# Patient Record
Sex: Female | Born: 1980 | Hispanic: Yes | Marital: Single | Smoking: Never smoker
Health system: Southern US, Community
[De-identification: ages and names within clinical notes are randomized; demographics above are authoritative.]

## PROBLEM LIST (undated history)

## (undated) ENCOUNTER — Inpatient Hospital Stay (HOSPITAL_COMMUNITY): Payer: Self-pay

## (undated) DIAGNOSIS — Z789 Other specified health status: Secondary | ICD-10-CM

## (undated) DIAGNOSIS — N6320 Unspecified lump in the left breast, unspecified quadrant: Secondary | ICD-10-CM

---

## 2002-12-20 ENCOUNTER — Emergency Department (HOSPITAL_COMMUNITY): Admission: EM | Admit: 2002-12-20 | Discharge: 2002-12-20 | Payer: Self-pay | Admitting: Emergency Medicine

## 2003-10-08 ENCOUNTER — Other Ambulatory Visit: Admission: RE | Admit: 2003-10-08 | Discharge: 2003-10-08 | Payer: Self-pay | Admitting: Gynecology

## 2009-08-30 ENCOUNTER — Encounter: Payer: Self-pay | Admitting: Family Medicine

## 2009-08-30 ENCOUNTER — Ambulatory Visit: Payer: Self-pay | Admitting: Family Medicine

## 2009-08-30 LAB — CONVERTED CEMR LAB
Eosinophils Absolute: 0.1 10*3/uL (ref 0.0–0.7)
Eosinophils Relative: 1 % (ref 0–5)
Hemoglobin: 11.1 g/dL — ABNORMAL LOW (ref 12.0–15.0)
Hepatitis B Surface Ag: NEGATIVE
Lymphocytes Relative: 20 % (ref 12–46)
Lymphs Abs: 1.7 10*3/uL (ref 0.7–4.0)
Monocytes Absolute: 0.6 10*3/uL (ref 0.1–1.0)
RBC: 4.06 M/uL (ref 3.87–5.11)
RDW: 14.3 % (ref 11.5–15.5)
Rubella: 129.9 intl units/mL — ABNORMAL HIGH
WBC: 8.7 10*3/uL (ref 4.0–10.5)

## 2009-09-06 ENCOUNTER — Encounter: Payer: Self-pay | Admitting: Family Medicine

## 2009-09-06 ENCOUNTER — Ambulatory Visit: Payer: Self-pay | Admitting: Family Medicine

## 2009-09-06 DIAGNOSIS — N898 Other specified noninflammatory disorders of vagina: Secondary | ICD-10-CM | POA: Insufficient documentation

## 2009-09-06 LAB — CONVERTED CEMR LAB
Pap Smear: NEGATIVE
Whiff Test: NEGATIVE

## 2009-09-14 ENCOUNTER — Ambulatory Visit (HOSPITAL_COMMUNITY): Admission: RE | Admit: 2009-09-14 | Discharge: 2009-09-14 | Payer: Self-pay | Admitting: Family Medicine

## 2009-09-14 ENCOUNTER — Encounter: Payer: Self-pay | Admitting: Family Medicine

## 2009-09-17 ENCOUNTER — Encounter: Payer: Self-pay | Admitting: Family Medicine

## 2009-10-04 ENCOUNTER — Ambulatory Visit: Payer: Self-pay | Admitting: Family Medicine

## 2009-10-06 ENCOUNTER — Encounter: Payer: Self-pay | Admitting: Family Medicine

## 2009-10-12 ENCOUNTER — Encounter: Payer: Self-pay | Admitting: *Deleted

## 2009-10-14 ENCOUNTER — Ambulatory Visit: Payer: Self-pay | Admitting: Family Medicine

## 2009-10-18 ENCOUNTER — Encounter: Payer: Self-pay | Admitting: Family Medicine

## 2009-10-20 ENCOUNTER — Ambulatory Visit (HOSPITAL_COMMUNITY): Admission: RE | Admit: 2009-10-20 | Discharge: 2009-10-20 | Payer: Self-pay | Admitting: Family Medicine

## 2009-10-20 ENCOUNTER — Encounter: Payer: Self-pay | Admitting: Family Medicine

## 2009-11-05 ENCOUNTER — Ambulatory Visit: Payer: Self-pay | Admitting: Family Medicine

## 2009-11-05 DIAGNOSIS — M545 Low back pain: Secondary | ICD-10-CM

## 2009-12-14 ENCOUNTER — Ambulatory Visit: Payer: Self-pay | Admitting: Family Medicine

## 2009-12-28 ENCOUNTER — Ambulatory Visit: Payer: Self-pay | Admitting: Family Medicine

## 2009-12-28 ENCOUNTER — Encounter: Payer: Self-pay | Admitting: Family Medicine

## 2009-12-28 LAB — CONVERTED CEMR LAB
HCT: 32.1 % — ABNORMAL LOW (ref 36.0–46.0)
WBC: 10 10*3/uL (ref 4.0–10.5)

## 2009-12-29 ENCOUNTER — Encounter: Payer: Self-pay | Admitting: Family Medicine

## 2009-12-29 DIAGNOSIS — R7309 Other abnormal glucose: Secondary | ICD-10-CM

## 2009-12-31 ENCOUNTER — Ambulatory Visit: Payer: Self-pay | Admitting: Family Medicine

## 2009-12-31 ENCOUNTER — Encounter: Payer: Self-pay | Admitting: Family Medicine

## 2010-01-11 ENCOUNTER — Ambulatory Visit: Payer: Self-pay | Admitting: Family Medicine

## 2010-01-27 ENCOUNTER — Ambulatory Visit: Payer: Self-pay | Admitting: Family Medicine

## 2010-02-10 ENCOUNTER — Ambulatory Visit: Payer: Self-pay | Admitting: Family Medicine

## 2010-02-18 ENCOUNTER — Telehealth: Payer: Self-pay | Admitting: Family Medicine

## 2010-02-18 ENCOUNTER — Ambulatory Visit: Payer: Self-pay | Admitting: Family Medicine

## 2010-02-18 ENCOUNTER — Inpatient Hospital Stay (HOSPITAL_COMMUNITY): Admission: AD | Admit: 2010-02-18 | Discharge: 2010-02-18 | Payer: Self-pay | Admitting: Family Medicine

## 2010-02-21 ENCOUNTER — Telehealth: Payer: Self-pay | Admitting: Family Medicine

## 2010-02-24 ENCOUNTER — Ambulatory Visit: Payer: Self-pay | Admitting: Family Medicine

## 2010-02-25 ENCOUNTER — Encounter: Payer: Self-pay | Admitting: Family Medicine

## 2010-03-03 ENCOUNTER — Ambulatory Visit: Payer: Self-pay | Admitting: Family Medicine

## 2010-03-09 ENCOUNTER — Ambulatory Visit: Payer: Self-pay | Admitting: Family Medicine

## 2010-03-17 ENCOUNTER — Encounter: Payer: Self-pay | Admitting: Family Medicine

## 2010-03-17 ENCOUNTER — Ambulatory Visit: Payer: Self-pay | Admitting: Family Medicine

## 2010-03-22 ENCOUNTER — Ambulatory Visit: Payer: Self-pay | Admitting: Family Medicine

## 2010-03-26 ENCOUNTER — Ambulatory Visit: Payer: Self-pay | Admitting: Obstetrics and Gynecology

## 2010-03-26 ENCOUNTER — Inpatient Hospital Stay (HOSPITAL_COMMUNITY): Admission: AD | Admit: 2010-03-26 | Discharge: 2010-03-29 | Payer: Self-pay | Admitting: Obstetrics & Gynecology

## 2010-03-27 ENCOUNTER — Encounter: Payer: Self-pay | Admitting: Obstetrics & Gynecology

## 2010-03-27 ENCOUNTER — Encounter: Payer: Self-pay | Admitting: Family Medicine

## 2010-03-31 ENCOUNTER — Ambulatory Visit: Payer: Self-pay | Admitting: Family Medicine

## 2010-03-31 DIAGNOSIS — D249 Benign neoplasm of unspecified breast: Secondary | ICD-10-CM

## 2010-04-07 ENCOUNTER — Ambulatory Visit: Payer: Self-pay | Admitting: Family Medicine

## 2010-04-07 DIAGNOSIS — N63 Unspecified lump in unspecified breast: Secondary | ICD-10-CM

## 2010-04-25 ENCOUNTER — Ambulatory Visit: Payer: Self-pay | Admitting: Family Medicine

## 2010-05-12 ENCOUNTER — Ambulatory Visit: Payer: Self-pay | Admitting: Family Medicine

## 2010-06-03 ENCOUNTER — Ambulatory Visit: Payer: Self-pay | Admitting: Family Medicine

## 2010-06-03 ENCOUNTER — Encounter: Payer: Self-pay | Admitting: Family Medicine

## 2010-08-21 ENCOUNTER — Encounter: Payer: Self-pay | Admitting: Family Medicine

## 2010-08-30 NOTE — Progress Notes (Signed)
Summary: f/u previous phone call       Additional Follow-up for Phone Call Additional follow up Details #2::    recieved message to call her. baby is not moving. when I called back, [facility] pt states baby is fine, it was just not moving. Follow-up by: Marines Jean Rosenthal    MAU report reviewed and copied into [employer]

## 2010-08-30 NOTE — Assessment & Plan Note (Signed)
Summary: OB VISIT/EO   Vital Signs:  Patient profile:   30 year old female Height:      60.25 inches Weight:      122.1 pounds BMI:     23.73 Temp:     97.8 degrees F oral Pulse rate:   91 / minute BP sitting:   97 / 60  (left arm) Cuff size:   regular  Vitals Entered By: Gladstone Pih (October 04, 2009 1:29 PM) CC: OB Is Patient Diabetic? No Pain Assessment Patient in pain? no      LMP (date): 06/09/2009 EDC by LMP==> 03/16/2010 [facility] 03/16/2010   Primary Care Provider:  Magnus Ivan MD  CC:  OB.  History of Present Illness: OB f/u: patient is here for second OB follow up.  patient complaining of pain in lower back when she tries to get up or after lying down for a few hours after work.  patient also reports a thin white discharge that does not itch.  otherwise no other complaints.  Habits & Providers  Alcohol-Tobacco-Diet     Tobacco Status: never     Cigarette Packs/Day: n/a  Review of Systems       as per hpi  Physical Exam  General:  NAD well-developed and well-nourished.   Heart:  II/VI systolic murmur heard best at RUSB Extremities:  trace left pedal edema and trace right pedal edema.   Psych:  memory intact for recent and remote, normally interactive, good eye contact, and not anxious appearing.     Impression & Recommendations:  Problem # 1:  SUPERVISION OF OTHER NORMAL PREGNANCY (ICD-V22.1) Assessment Unchanged  Patient currently at 16 weeks and 5 days.  There was not too much to do on this visit besides asking regular flowsheet questions and confirming if I missed any intake questions from our first visit.  Patient is interested in getting the quad screen.  She has the [facility] orange card so I believe this should be free.  Will write or call in a prescription for tylenol for the patient's back pain.  Fetal heart tones reassuring.  Will follow up with patient in 1 month.  Orders: Other OB visit- [facility])  Prenatal Visit [facility]  Confirmation:    New working [facility]: 03/16/2010    EDC by LMP: 03/16/2010 Ultrasound Dating Information:    First U/S on 09/14/2009   Gest age: 12w 6d    EDC: 03/23/2010.   Flowsheet View for Follow-up Visit    Estimated weeks of       gestation:     16 5/7    Weight:     122.1    Blood pressure:   97 / 60    Headache:     No    Nausea/vomiting:   No    Edema:     0    Vaginal bleeding:   no    Vaginal discharge:   d/c, white clear, not like yeast infection    FHR:       150's    Fetal activity:     yes, a little     Labor symptoms:   some pain on abdomen    Taking prenatal vits?   Y    Smoking:     n/a    Preceptor:     dr Leveda Anna    Flowsheet View for Follow-up Visit    Estimated weeks of       gestation:     16 5/7  Weight:     122.1    Blood pressure:   97 / 60    Hx headache?     No    Nausea/vomiting?   No    Edema?     0    Bleeding?     no    Leakage/discharge?   d/c, white clear, not like yeast infection    Fetal activity:       yes, a little     Labor symptoms?   some pain on abdomen    FHR:       150's    Taking Vitamins?   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    Number of children at home: 2    [facility] delivery: [facility]  FOB Information    Husband/Father of baby: Tamala Bari    FOB Comments: not supportive   Menstrual History    LMP (date): 06/09/2009    EDC by LMP: 03/16/2010    Best Working EDC: 03/16/2010    LMP - Character: normal    Menarche: 16 years    Menses interval: very regular days    Menstrual flow 3-4 days    On BCP's at conception: no    Symptoms since LMP: amenorrhea, fatigue, irritability, urinary frequency    Other symptoms: some irritability, drinks a lot of water=urinary  frequency    Past Pregnancy History    Gravida:     1    Term Births:     0    Premature Births:   0    Living Children:   0    Para:       0    Mult. Births:     0    Prev C-Section:   0    Prev. VBAC attempt?   0    Aborta:     0    Elect. Ab:     0    Spont. Ab:     0    Ectopics:     0      Appended Document: OB VISIT/EO Dating sono at 23 and 6/7 gives EDD of 03/23/10.  Pt desires quad screen, should return to our lab for lab visit for that.  Also needs to have anatomy scan scheduled before next visit.

## 2010-08-30 NOTE — Assessment & Plan Note (Signed)
Summary: PP visit/mj   Vital Signs:  Patient profile:   30 year old female Height:      60.25 inches Weight:      123 pounds BMI:     23.91 Temp:     98.3 degrees F oral Pulse rate:   75 / minute BP sitting:   99 / 64  (left arm) Cuff size:   regular  Vitals Entered By: Jimmy Footman, CMA (May 12, 2010 10:26 AM) CC: PP visit Is Patient Diabetic? No Pain Assessment Patient in pain? no      Comments vaginal delivery, denies pain or bleeding   Primary Care Provider:  Alvia Grove DO  CC:  PP visit.  History of Present Illness: 30 yo female here for PP visit. Bonding well with infant.  Breastfeeding without problems. No feelings of sadness.  Adjusting well to new baby.  Continues to be able to participate in activites she enjoys.   Husband is very supportive and helps with the care of the infant. Has not resumed sexual intercourse.   Interested in Implanon for [facility].  No vaginal bleeding, no abdominal pain.    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) 5)  Other Abnormal Glucose  (ICD-790.29) 6)  Back Pain, Lumbar  (ICD-724.2) 7)  Vaginal Discharge  (ICD-623.5) 8)  Screening For Malignant Neoplasm of The Cervix  (ICD-V76.2)  Current Medications (verified): 1)  Pre-Natal Formula  Tabs (Prenatal Multivit-Min-Fe-Fa)  Allergies (verified): No Known Drug Allergies  Past History:  Past Medical History: Last updated: 04/07/2010 NSVD in 08//2011  Family History: Last updated: 04/07/2010 no hx of breast ca  Social History: Last updated: 04/25/2010 moved from [place] about 2  yrs ago One son Married Never Smoked Drug use-no  Risk Factors: Alcohol Use: 0 (04/25/2010) Exercise: yes (04/25/2010)  Risk  Factors: Smoking Status: never (05/12/2010) Packs/Day: n/a (04/07/2010)  Review of Systems  The patient denies anorexia, fever, weight loss, weight gain, vision loss, decreased hearing, hoarseness, chest pain, syncope, dyspnea on exertion, peripheral edema, prolonged cough, headaches, hemoptysis, abdominal pain, melena, hematochezia, severe indigestion/heartburn, hematuria, incontinence, genital sores, muscle weakness, suspicious skin lesions, transient blindness, difficulty walking, depression, unusual weight change, abnormal bleeding, enlarged lymph nodes, angioedema, breast masses, and testicular masses.    Physical Exam  General:  VS reviewed, alert, well-developed, well-nourished, and well-hydrated.   Breasts:  slightly engorged, colostrum discharge, no abonormal skin or nipple changes  6 x 6 cm mass in left axilla without erythema or increased warmth, no pain with palpation, with expressible milky liquid from enlarged pore in center.  Lungs:  Normal respiratory effort, chest expands symmetrically. Lungs are clear to auscultation, no crackles or wheezes. Heart:  Normal rate and regular rhythm. S1 and S2 normal without gallop, murmur, click, rub or other extra sounds. Abdomen:  soft, non-tender, normal bowel sounds, no distention, and no masses.   Extremities:  No clubbing, cyanosis, edema, or deformity noted with normal full range of motion of all joints.   Neurologic:  alert & oriented X3 and DTRs symmetrical and normal.   Skin:  turgor normal and color normal.   Psych:  good eye contact, not anxious appearing, and not depressed appearing.    Pelvic Exam  Vulva:  normal appearance, normal hair distribution, no lesions or masses.   Vagina:      normal, ruggated, physiologic discharge, no lesions, no masses, no cystocele, adequate pelvic support.   Cervix:      normal, midposition, no CMT, no lesions.   Uterus:      smooth, anteverted, anteflexed, mobile, non-tender, firm,  adequate support, no prolapse.   Adnexa:      normal, no masses bilaterally, mobile bilaterally, nontender bilaterally.      Impression & Recommendations:  Problem # 1:  POSTPARTUM CARE&EXAMINATION OF LACTATING MOTHER (ICD-V24.1) Doing well. Will schedule appt in November for Implanon.  Pt will qualify for no charge Implanon. see pt instructions [id number]  Problem # 2:  CONTRACEPTIVE MANAGEMENT (ICD-V25.09) as above Orders: [facility]partum visit- [facility] ([id number])  Complete Medication List: 1)  Pre-natal Formula Tabs (Prenatal multivit-min-fe-fa)  Patient Instructions: 1)  Great to see you! 2)  I will put in your Implanon in at your next visit in November, there will be no charge for this visit or the Implanon. 3)  You can get pregnaut while you are breast feeding, make sure you use condoms if you have sex.

## 2010-08-30 NOTE — Assessment & Plan Note (Signed)
Summary: ob,tcb   Vital Signs:  Patient profile:   30 year old female Weight:      146.3 pounds BP sitting:   99 / 65  Habits & Providers  Alcohol-Tobacco-Diet     Cigarette Packs/Day: n/a  Allergies: No Known Drug Allergies  Physical Exam  General:  VS reviewed, alert, well-developed, and well-hydrated.   Abdomen:  gravid Extremities:  trace edema bilateral LE Neurologic:  DTRs symmetrical and normal.     Impression & Recommendations:  Problem # 1:  SUPERVISION OF OTHER NORMAL PREGNANCY (ICD-V22.1) 29yo G1P0 at 39.[redacted] weeks gestation, dated by first U/S at [redacted]W[redacted]D.  EDD 03-23-10 Initial labs: O+, Ab -, Hgb 11.1, Hct 32.7, Plt 259, HBsAg neg, Rubella immune, RPR NR, HIV NR, Sickle cell neg, GC/Chlamydia neg, GBS neg 1hrGTT: 169 3hr: [phone] Quad screen Normal Female gender, plans to breast feed. [facility] for pediatrician Discussed pain management options for labor.  Pt will think about it and we will discuss further next week.   f/u next week.    discussed back pain she is having - likely related to pregnancy.    Orders: Other OB visit- [facility] ([facility])  Complete Medication List: 1)  Pre-natal Formula Tabs (Prenatal multivit-min-fe-fa)  Patient Instructions: 1)  Nice to see you! 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    Number of children at home: 2    [facility]ospital [facility] delivery: [facility]  FOB Information    Husband/Father of baby: Tamala Bari    FOB Comments: not supportive   Menstrual History    LMP (date): 06/09/2009    LMP  - Character: normal    Menarche: 16 years    Menses interval: very regular days    Menstrual flow 3-4 days    On BCP's at conception: no   Flowsheet View for Follow-up Visit    Estimated weeks of       gestation:     39 1/7    Weight:     146.3    Blood pressure:   99 / 65    Headache:     No    Nausea/vomiting:   No    Edema:     1+LE    Vaginal bleeding:   no    Vaginal discharge:   no    Fundal height:      39    FHR:       140's    Fetal activity:     yes    Labor symptoms:   few ctx    Fetal position:     vertex    Taking prenatal vits?   Y    Smoking:     n/a    Next visit:     1 week    Resident:     Braulio Conte    Preceptor:     Mauricio Po

## 2010-08-30 NOTE — Assessment & Plan Note (Signed)
Summary: ob f/u per Dr. Gareth Morgan   Vital Signs:  Patient profile:   30 year old female Weight:      139 pounds BP sitting:   97 / 60  Habits & Providers  Alcohol-Tobacco-Diet     Cigarette Packs/Day: n/a  Current Medications (verified): 1)  Pre-Natal Formula  Tabs (Prenatal Multivit-Min-Fe-Fa)  Physical Exam  General:  Vs reviewed, alert, well-developed, well-nourished, and well-hydrated.   vs noted  Abdomen:  gravid   Impression & Recommendations:  Problem # 1:  SUPERVISION OF OTHER NORMAL PREGNANCY (ICD-V22.1) Assessment Unchanged  30yo G1P0 at 32.[redacted] weeks gestation, dated by first U/S at 100W6D.  EDD 03-23-10 Initial labs: O+, Ab -, Hgb 11.1, Hct 32.7, Plt 259, HBsAg neg, Rubella immune, RPR NR, HIV NR, Sickle cell neg, GC/Chlamydia neg, PAP neg 1hrGTT: 169 3hr: [phone] Quad screen Normal Female gender  Follow up in 2 weeks.  overall doing well.   Orders: Other OB visit- [facility] ([facility]K)  Complete Medication List: 1)  Pre-natal Formula Tabs (Prenatal multivit-min-fe-fa)  Patient Instructions: 1)  Baby sounds great! 2)  If baby is not moving well, lay down in a quiet room and count how often baby is moving.  If it is less than 4 times in 1 hour, please go to [facility] hospital.   3)  If you have any bleeding or leaking or if you have regular contractions please go to [facility] hospital. 4)  Please follow up with Dr Gomez Cleverly or Dr Sandi Mealy in 2 weeks for your next check up.  Prenatal Visit      This is a 30 years old female G1, P0, T0, PT0, LC0, Ab0 who is in for a prenatal visit.  Since the last visit, she notes that she is doing well and has no concerns.   Flowsheet View for Follow-up Visit    Estimated weeks of       gestation:     32 1/7    Weight:     139    Blood pressure:   97 / 60    Headache:     No    Nausea/vomiting:   No    Edema:     TrLE    Vaginal bleeding:   no    Vaginal discharge:   d/c    Fundal height:      32    FHR:       150s    Fetal  activity:     yes    Labor symptoms:   no    Fetal position:     vertex    Taking prenatal vits?   Y    Smoking:     n/a    Next visit:     2 wk    Resident:     Sandi Mealy    Preceptor:     Mauricio Po

## 2010-08-30 NOTE — Letter (Signed)
Summary: Handout Printed  Printed Handout:  - Prenatal-Record-CCC 

## 2010-08-30 NOTE — Assessment & Plan Note (Signed)
Summary: ob visit/eo   Vital Signs:  Patient profile:   30 year old female Weight:      130 pounds Pulse rate:   88 / minute BP sitting:   108 / 68  Vitals Entered By: Garen Grams LPN (November 06, 6043 3:19 PM) LMP (date): 06/09/2009 [facility] 03/23/2010   Habits & Providers  Alcohol-Tobacco-Diet     Cigarette Packs/Day: n/a  Physical Exam  General:  VS reviewed alert, well-developed, well-nourished, and well-hydrated.   Neck:  supple and full ROM.   Lungs:  normal respiratory effort and normal breath sounds.   Heart:  Normal rate and regular rhythm. S1 and S2 normal without gallop, murmur, click, rub or other extra sounds.   Detailed Back/Spine Exam  Lumbosacral Exam:  Inspection-deformity:    Normal Palpation-spinal tenderness:  Normal Range of Motion:    Forward Flexion:   60 degrees    Hyperextension:   25 degrees    Schober's:        >6 cm    Right Lateral Bend:   25 degrees    Left Lateral Bend:   25 degrees Squatting:  normal Lying Straight Leg Raise:    Right:  negative    Left:  negative Sitting Straight Leg Raise:    Right:  negative    Left:  negative Reverse Straight Leg Raise:    Right:  negative    Left:  negative Contralateral Straight Leg Raise:    Right:  negative    Left:  negative Sciatic Notch:    There is no sciatic notch tenderness. Toe Walking:    Right:  normal    Left:  normal Heel Walking:    Right:  normal    Left:  normal Patrick's Maneuver:    Right:  negative    Left:  negative Fabere Test:    Right:  negative    Left:  negative   Impression & Recommendations:  Problem # 1:  SUPERVISION OF OTHER NORMAL PREGNANCY (ICD-V22.1) Assessment Unchanged  30yo G1P0 at 20.[redacted] weeks gestation, dated by first U/S at [redacted]W[redacted]D.  EDD 03-23-10 Initial labs: O+, Ab -, Hgb 11.1, Hct 32.7, Plt 259, HBsAg neg, Rubella immune, RPR NR, HIV NR, Sickle cell neg, GC/Chlamydia neg, PAP neg  Quad screen Normal  Female gender Follow up in 4  weeks  Orders: Other OB visit- [facility] (OBCK)  Problem # 2:  BACK PAIN, LUMBAR (ICD-724.2) Assessment: New  Likely muscular.  Only occurs after naps during the day.  PE of lumbar region umremarkable.  No red flag s/s.  Tylenol and heating pad.  Orders: Other [facility] visit- [facility])  Patient Instructions: 1)  Nice to meet you! 2)  Baby sounds great today! 3)  You can take Tylenol for your pain and use a heating pad. 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    Number of children at home: 2    [facility]ospital [facility] delivery: [facility]  FOB Information    Husband/Father of baby: Tamala Bari    FOB Comments: not supportive   Menstrual History    LMP (date): 06/09/2009    Best Working EDC: 03/23/2010    LMP - Character: normal  Menarche: 16 years    Menses interval: very regular days    Menstrual flow 3-4 days    On BCP's at conception: no   Flowsheet View for Follow-up Visit    Estimated weeks of       gestation:     20 2/7    Weight:     130    Blood pressure:   108 / 68    Headache:     No    Nausea/vomiting:   No    Edema:     0    Vaginal bleeding:   no    Vaginal discharge:   no    Fundal height:      20    FHR:       148    Fetal activity:     yes    Labor symptoms:   no    Fetal position:     N/A    Taking prenatal vits?   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  Notes only after she takes a nap.  When she wakes up from her nap feels lower back pain.  Self-resolves after about 30 minutes.  No weakness associated with back pain, no loss of bowel or bladder.  EDC Confirmation:    New working [facility]: 03/23/2010 Ultrasound Dating Information:    First U/S on 09/14/2009   Gest age: 04V4U   [facility]: 03/23/2010.    Second U/S on 10/20/2009   Gest age: [redacted]W[redacted]D   EDC: 03/24/2010.

## 2010-08-30 NOTE — Miscellaneous (Signed)
Summary: Orders Update  Clinical Lists Changes  Orders: Added new Test order of AFP/Quad Scr-[facility] ([id number]) - Signed Added new Test order of Prenatal U/S > 14 weeks - 09811 (Prenatal U/S) - Signed Observations: Added new observation of GEST AGE EST: 16 6/7 (10/12/2009 22:16)  30 yo G1P0 at 17.[redacted] weeks gestation by [redacted]w[redacted]d U/S, EDD 03/23/10. Pt scheduled for f/u visit with me 11/05/09.  Will need anatomy scan next week. Will place order for that now and have RN schedule and call pt with appt.  Per previous notes seems pt desires quad screen, needs to be done b/t 15-18 weeks, will submit order for that now and pt needs to be called and instructed to come to clinic this week for lab draw.  Flag sent to blue team in regards to above.   See order. ............................................... Delora Fuel October 13, 2009 2:59 PM     Flowsheet View for Follow-up Visit    Estimated weeks of       gestation:     16 6/7

## 2010-08-30 NOTE — Assessment & Plan Note (Signed)
Summary: ob ck,df   Vital Signs:  Patient profile:   30 year old female Weight:      148 pounds BP sitting:   106 / 72  Habits & Providers  Alcohol-Tobacco-Diet     Cigarette Packs/Day: n/a  Allergies: No Known Drug Allergies  Physical Exam  General:  vs reviewed, alert, well-developed, well-nourished, and well-hydrated.   Heart:  Normal rate and regular rhythm. S1 and S2 normal without gallop, murmur, click, rub or other extra sounds. Abdomen:  gravid Extremities:  trace edema bilateral LE Neurologic:  DTRs symmetrical and normal.   Skin:  turgor normal and color normal.     Impression & Recommendations:  Problem # 1:  SUPERVISION OF OTHER NORMAL PREGNANCY (ICD-V22.1)  29yo G1P0 at 39.[redacted] weeks gestation, dated by first U/S at [redacted]W[redacted]D.  EDD 03-23-10 Initial labs: O+, Ab -, Hgb 11.1, Hct 32.7, Plt 259, HBsAg neg, Rubella immune, RPR NR, HIV NR, Sickle cell neg, GC/Chlamydia neg, GBS neg 1hrGTT: 169 3hr: [phone] Quad screen Normal Female gender, plans to breast feed. [facility] for pediatrician Discussed pain management options for labor.  Would like to try natural for as long as possible, but not opposed to meds if needed.    discussed back pain she is having - likely related to pregnancy.    Orders: Other OB visit- [facility] (OBCK)  Complete Medication List: 1)  Pre-natal Formula Tabs (Prenatal multivit-min-fe-fa)  Patient Instructions: 1)   Nice to see you! 2)     If you have any bleeding or leaking please go to [facility] hospital 3)    Continue taking your prenatal vitamins daily 4)  Your due date is 03-23-10, if the baby does not come by next week, I will schedule an induction for you at [facility] hospital.  i will call you with the date and time next week. 5)    If you go to [facility] hospital please page me and let me know you are there: [phone] 6)  You don't have to walk if you don't want to!!  Prenatal Visit Concerns noted: having few contractions.  unable to time  them.  pain 2//10. anxious for delivery [facility] Confirmation:    New working [facility]: 03/23/2010   Flowsheet View for Follow-up Visit    Estimated weeks of       gestation:     39 6/7    Weight:     148    Blood pressure:   106 / 72    Headache:     No    Nausea/vomiting:   No    Edema:     TrLE    Vaginal bleeding:   no    Vaginal discharge:   no    Fundal height:      39    FHR:       140's    Fetal activity:     yes    Labor symptoms:   few ctx    Fetal position:     vertex    Taking prenatal vits?   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

## 2010-08-30 NOTE — Assessment & Plan Note (Signed)
Summary: OB F/U [facility]   Vital Signs:  Patient profile:   30 year old female Weight:      143.4 pounds BP sitting:   103 / 68  Vitals Entered By: Dennison Nancy RN (February 24, 2010 3:08 PM)  Primary Care Provider:  Alvia Grove DO   History of Present Illness: overall doing well but having some low back pain. pain is constant and doesn't move anywhere.  she hasn't taken anything to try to help her discomfort.  there is no associated abdominal pain, headaches, fever, bleeding, leaking of fluid and fetal movement is active.  Habits & Providers  Alcohol-Tobacco-Diet     Cigarette Packs/Day: n/a  Current Medications (verified): 1)  Pre-Natal Formula  Tabs (Prenatal Multivit-Min-Fe-Fa)  Allergies (verified): No Known Drug Allergies  Physical Exam  General:  Vs reviewed, alert, well-developed, well-nourished, and well-hydrated.   vs noted  Abdomen:  gravid   Impression & Recommendations:  Problem # 1:  SUPERVISION OF OTHER NORMAL PREGNANCY (ICD-V22.1) Assessment Unchanged  Orders: Grp B Probe-[facility] ([id number]) Other OB visit- [facility] (OBCK)  30yo G1P0 at 36.[redacted] weeks gestation, dated by first U/S at 110W6D.  EDD 03-23-10 Initial labs: O+, Ab -, Hgb 11.1, Hct 32.7, Plt 259, HBsAg neg, Rubella immune, RPR NR, HIV NR, Sickle cell neg, GC/Chlamydia neg, PAP neg 1hrGTT: 169 3hr: [phone] Quad screen Normal Female gender  f/u next week.  GBS perfored today.  over age 48 and without concern for GC/Chlam so not repeated today. Could readdress at next visit as well.  discussed back pain she is having - likely related to pregnancy.    Complete Medication List: 1)  Pre-natal Formula Tabs (Prenatal multivit-min-fe-fa)  Patient Instructions: 1)  You have follow up scheduled with Dr Gomez Cleverly on: 2)  03/03/10 at 315pm 3)  03/09/10 at 345pm 4)  Baby sounds great! 5)  If baby is not moving well, lay down in a quiet room and count how often baby is moving.  If it is less than 4 times in 1  hour, please go to [facility] hospital.   6)  If you have any bleeding or leaking or if you have regular contractions please go to [facility] hospital. 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    Number of children at home: 2    [facility] delivery[facility]  FOB Information    Husband/Father of baby: Tamala Bari    FOB Comments: not supportive   Menstrual History    LMP (date): 06/09/2009    LMP - Character: normal    Menarche: 16 years    Menses interval: very regular days    Menstrual flow 3-4 days    On BCP's at conception: no   Flowsheet View for Follow-up Visit    Estimated weeks of       gestation:     36 1/7    Weight:     143.4    Blood pressure:   103 / 68    Headache:     No    Nausea/vomiting:   No    Edema:     1+LE    Vaginal bleeding:   no  Vaginal discharge:   no    Fundal height:      36    FHR:       140s    Fetal activity:     yes    Labor symptoms:   no    Fetal position:     vertex    Cx Dilation:     FT    Cx Effacement:   20%    Cx Station:     high    Taking prenatal vits?   Y    Smoking:     n/a    Next visit:     1 wk    Resident:     Sandi Mealy    Preceptor:     Bowen

## 2010-08-30 NOTE — Assessment & Plan Note (Signed)
Summary: ob visit/eo   Vital Signs:  Patient profile:   30 year old female Weight:      136.6 pounds BP sitting:   95 / 60  Habits & Providers  Alcohol-Tobacco-Diet     Cigarette Packs/Day: n/a  Physical Exam  General:  VS reviewed, alert, well-developed, well-nourished, and well-hydrated.   Lungs:  normal respiratory effort.   Heart:  normal rate and regular rhythm.   Abdomen:  gravid Extremities:  no edema   Impression & Recommendations:  Problem # 1:  SUPERVISION OF OTHER NORMAL PREGNANCY (ICD-V22.1)  30yo G1P0 at 27.[redacted] weeks gestation, dated by first U/S at [redacted]W[redacted]D.  EDD 03-23-10 Initial labs: O+, Ab -, Hgb 11.1, Hct 32.7, Plt 259, HBsAg neg, Rubella immune, RPR NR, HIV NR, Sickle cell neg, GC/Chlamydia neg, PAP neg 1hrGTT today and 28 wk labs Quad screen Normal Female gender  Follow up in 2 weeks.  Orders: Glucose 1 hr-[facility] (82950) CBC-[facility] ([id number]) HIV-[facility] ([id number]) RPR-[facility] ([id number]) Other OB visit- [facility] (OBCK)  Complete Medication List: 1)  Pre-natal Formula Tabs (Prenatal multivit-min-fe-fa)   Flowsheet View for Follow-up Visit    Estimated weeks of       gestation:     27 6/7    Weight:     136.6    Blood pressure:   95 / 60    Hx headache?     No    Nausea/vomiting?   No    Edema?     0    Bleeding?     no    Leakage/discharge?   no    Fetal activity:       yes    Labor symptoms?   no    Fundal height:      27    FHR:       145    Fetal position:      N/A    Taking Vitamins?   Y    Smoking PPD:   n/a    Next visit:     2 wk    Resident:     Gomez Cleverly    Preceptor:     Jennette Kettle  Patient Instructions: 1)  Great to see you! 2)  Your due date is:  3)  Baby sounds great! 4)  Please see me in 2-3 weeks, from this point on, I will see you every 2-3 weeks until you are 32 weeks, then every 2 weeks until 36 weeks, and then every week until you deliver.  Prenatal Visit Concerns noted: No concerns.  Back Pain resolved.    EDC Confirmation: Ultrasound  Dating Information:    Second U/S on 10/20/2009   Gest age: [redacted]w[redacted]d   EDC: 03/24/2010.

## 2010-08-30 NOTE — Assessment & Plan Note (Signed)
Summary: OB/KH   Vital Signs:  Patient profile:   30 year old female Weight:      137.5 pounds Pulse rate:   98 / minute BP sitting:   98 / 65  Vitals Entered By: Garen Grams LPN (January 11, 2010 1:45 PM)  Habits & Providers  Alcohol-Tobacco-Diet     Cigarette Packs/Day: n/a  Physical Exam  General:  Vs reviewed, alert, well-developed, well-nourished, and well-hydrated.   Abdomen:  gravid Extremities:  no edema Neurologic:  DTRs symmetrical and normal.     Impression & Recommendations:  Problem # 1:  SUPERVISION OF OTHER NORMAL PREGNANCY (ICD-V22.1)  30yo G1P0 at 29.[redacted] weeks gestation, dated by first U/S at [redacted]W[redacted]D.  EDD 03-23-10 Initial labs: O+, Ab -, Hgb 11.1, Hct 32.7, Plt 259, HBsAg neg, Rubella immune, RPR NR, HIV NR, Sickle cell neg, GC/Chlamydia neg, PAP neg 1hrGTT: 169 3hr: [phone] Quad screen Normal Female gender  Follow up in 2 weeks.  Orders: Other OB visit- [facility] (OBCK)  Complete Medication List: 1)  Pre-natal Formula Tabs (Prenatal multivit-min-fe-fa) 2)  Fluconazole 150 Mg Tabs (Fluconazole) .... Take 1 pill by mouth today  Patient Instructions: 1)  Great to see you! 2)  Baby sounds great! 3)  Please take diflucan by mouth one time for yeast infection. 4)  If baby is not moving well, lay down in a quiet room and count how often baby is moving.  If it is less than 4 times in 1 hour, please go to [facility] hospital.   5)  If you hav any bleeding or leaking, please go to [facility] hospital.   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    Number of children at home: 2    [facility]ospital [facility] delivery: [facility]  FOB Information    Husband/Father of baby: Jacqueline Walter    FOB Comments: not supportive   Menstrual History    LMP (date): 06/09/2009    LMP - Character: normal  Menarche: 16 years    Menses interval: very regular days    Menstrual flow 3-4 days    On BCP's at conception: no   Flowsheet View for Follow-up Visit    Estimated weeks of       gestation:     29 6/7    Weight:     137.5    Blood pressure:   98 / 65    Headache:     No    Nausea/vomiting:   No    Edema:     0    Vaginal bleeding:   no    Vaginal discharge:   no    Fundal height:      30    FHR:       150    Fetal activity:     yes    Labor symptoms:   no    Taking prenatal vits?   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  EDC Confirmation: Ultrasound Dating Information:    Second U/S on 10/20/2009   Gest age: [redacted]w[redacted]d   EDC: 03/24/2010.

## 2010-08-30 NOTE — Assessment & Plan Note (Signed)
Summary: knot under arm leaking fluid/[facility]   Vital Signs:  Patient profile:   30 year old female Height:      60.25 inches Weight:      138 pounds BMI:     26.82 Temp:     98.8 degrees F oral Pulse rate:   78 / minute BP sitting:   114 / 74  (right arm) Cuff size:   regular  Vitals Entered By: Jimmy Footman, CMA (March 31, 2010 9:44 AM) CC: knot on left breast getting larger. Is Patient Diabetic? No Pain Assessment Patient in pain? yes     Location: breast  Intensity: 10 Comments Maternal grandmother passed away from cervical cancer. Patient states that her sister had the same knot   Primary Care Jean Skow:  Alvia Grove DO  CC:  knot on left breast getting larger.Marland Kitchen  History of Present Illness: 1) Knot under left arm: Has been present for 10 years. 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 Was about the size of a large marble. Started growing toward the end of her pregnancy, now is the size of a small orange. Has been draining liquid that looks and smells like colostrum from the center of the mass. Mass is engorged and painful but has not been red or warm to touch. Patient is four days post partum. Has been breast feeding without difficulty.   ROS: Denies chest pain, fever, chills, nausea, emesis, pain or swelling elsewhere, erythema, arm weakness or numbness    Habits & Providers  Alcohol-Tobacco-Diet     Tobacco Status: never  Current Medications (verified): 1)  Pre-Natal Formula  Tabs (Prenatal Multivit-Min-Fe-Fa)  Allergies (verified): No Known Drug Allergies  Physical Exam  General:  vs reviewed, alert, well-developed, well-nourished, and well-hydrated.   Neck:  no lymphadenopathy   Breasts:  slightly engorged, colostrum discharge, no abonormal skin or nipple changes  6 x 6 cm mass in left axilla without erythema or increased warmth, mildly tender to palpation, with expressible milky liquid from enlarged pore in center.  Lungs:  Normal  respiratory effort, chest expands symmetrically. Lungs are clear to auscultation, no crackles or wheezes. Heart:  Normal rate and regular rhythm. S1 and S2 normal without gallop, murmur, click, rub or other extra sounds. Cervical Nodes:  No lymphadenopathy noted Axillary Nodes:  No palpable lymphadenopathy   Impression & Recommendations:  Problem # 1:  BENIGN NEOPLASM OF BREAST (ICD-217)  History and exam consistent with ectopic / axillary breast tissue. Will treat as would treat for breast engorgement with warm compresses, NSAIDs for pain. No signs of infection. Reviewed red flags that would prompt return to care including worsening pain, purulent discharge, erythema. Patient expressed understanding.   Orders: [facility]- Est Level  3 (16109)  Complete Medication List: 1)  Pre-natal Formula Tabs (Prenatal multivit-min-fe-fa)

## 2010-08-30 NOTE — Assessment & Plan Note (Signed)
Summary: f/u/kh   Primary Care Provider:  Alvia Grove DO   History of Present Illness: 30 yo female here for f/u of swelling under her left axilla . Since last OV pt reports no change in size of this area.   No redness, no pain, no discharge, but does note milk production when breasts are full.  No fevers.  Area is not bothering the patient and she is able to continue to successfully breast feed her son.   Habits & Providers  Alcohol-Tobacco-Diet     Alcohol drinks/day: 0     Tobacco Status: never  Exercise-Depression-Behavior     Does Patient Exercise: yes     Have you felt down or hopeless? no     Drug Use: never  Current Problems (verified): 1)  Contraceptive Management  (ICD-V25.09) 2)  Lump or Mass in Breast  (ICD-611.72) 3)  Benign Neoplasm of Breast  (ICD-217) 4)  Other Abnormal Glucose  (ICD-790.29) 5)  Back Pain, Lumbar  (ICD-724.2) 6)  Vaginal Discharge  (ICD-623.5) 7)  Screening For Malignant Neoplasm of The Cervix  (ICD-V76.2)  Current Medications (verified): 1)  Pre-Natal Formula  Tabs (Prenatal Multivit-Min-Fe-Fa)  Allergies (verified): No Known Drug Allergies  Past History:  Past Medical History: Last updated: 04/07/2010 NSVD in 08//2011  Family History: Last updated: 04/07/2010 no hx of breast ca  Social History: Last updated: 04/25/2010 moved from [place] about 2  yrs ago One son Married Never Smoked Drug use-no  Risk Factors: Alcohol Use: 0 (04/25/2010) Exercise: yes (04/25/2010)  Risk Factors: Smoking Status: never (04/25/2010) Packs/Day: n/a (04/07/2010)  Social History: Reviewed history from 04/07/2010 and no changes required. moved from [place] about 2  yrs ago One son Married Never Smoked Drug use-no Does Patient Exercise:  yes Drug Use:  never  Review of Systems  The patient denies anorexia, fever, weight loss, weight gain, vision loss, decreased hearing, hoarseness, chest pain, syncope, dyspnea on exertion, peripheral  edema, prolonged cough, headaches, hemoptysis, abdominal pain, melena, hematochezia, severe indigestion/heartburn, hematuria, incontinence, genital sores, muscle weakness, suspicious skin lesions, transient blindness, difficulty walking, depression, unusual weight change, abnormal bleeding, enlarged lymph nodes, angioedema, breast masses, and testicular masses.    Physical Exam  General:  alert, well-developed, well-nourished, and well-hydrated.   Breasts:  slightly engorged, colostrum discharge, no abonormal skin or nipple changes  6 x 6 cm mass in left axilla without erythema or increased warmth, no pain with palpation, with expressible milky liquid from enlarged pore in center.    Impression & Recommendations:  Problem # 1:  LUMP OR MASS IN BREAST (ICD-611.72) stable.  No s/s of infection.  Likely will decrease in size over time.  Pt continues to breast feed and is doing extremely well.  Encouraged to continue Orders: [facility]- Est Level  3 ([id number])  Problem # 2:  CONTRACEPTIVE MANAGEMENT (ICD-V25.09) Reviewed multiple options with pt.  Would like Implanon.  Discussed risks, possible side effects (most commonly unpredictable periods), and benefits.   Will try and arrange no cost Implanon for pt.  Orders: [facility]- Est Level  3 (44034)  Complete Medication List: 1)  Pre-natal Formula Tabs (Prenatal multivit-min-fe-fa)  Patient Instructions: 1)  Great to see you! 2)  Schedule an appt in 2 weeks for your PP visit and I will put in your Implanon at that time!

## 2010-08-30 NOTE — Assessment & Plan Note (Signed)
Summary: ob visit,tcb   Vital Signs:  Patient profile:   30 year old female Weight:      145.1 pounds Pulse rate:   85 / minute BP sitting:   110 / 67  Vitals Entered By: Garen Grams LPN (March 09, 2010 3:47 PM)  Habits & Providers  Alcohol-Tobacco-Diet     Cigarette Packs/Day: n/a  Allergies: No Known Drug Allergies   Impression & Recommendations:  Problem # 1:  SUPERVISION OF OTHER NORMAL PREGNANCY (ICD-V22.1)  30yo G1P0 at 38.[redacted] weeks gestation, dated by first U/S at [redacted]W[redacted]D.  EDD 03-23-10 Initial labs: O+, Ab -, Hgb 11.1, Hct 32.7, Plt 259, HBsAg neg, Rubella immune, RPR NR, HIV NR, Sickle cell neg, GC/Chlamydia neg, GBS neg 1hrGTT: 169 3hr: [phone] Quad screen Normal Female gender, plans to breast feed. [facility] for pediatrician Discussed pain management options for labor.  Pt will think about it and we will discuss further next week.   f/u next week.    discussed back pain she is having - likely related to pregnancy.    Orders: Other OB visit- [facility] (OBCK)  Complete Medication List: 1)  Pre-natal Formula Tabs (Prenatal multivit-min-fe-fa)  Patient Instructions: 1)  Nice to see you! 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    Number of children at home: 2    [facility]ospital [facility] delivery: [facility]  FOB Information    Husband/Father of baby: Tamala Bari    FOB Comments: not supportive   Menstrual History    LMP (date): 06/09/2009    LMP - Character: normal    Menarche: 16 years    Menses interval: very regular days    Menstrual flow 3-4 days    On BCP's at conception: no   Flowsheet View for Follow-up  Visit    Estimated weeks of       gestation:     38 0/7    Weight:     145.1    Blood pressure:   110 / 67    Headache:     No    Nausea/vomiting:   No    Edema:     1+LE    Vaginal bleeding:   no    Vaginal discharge:   no    Fundal height:      37    FHR:       140's    Fetal activity:     yes    Labor symptoms:   few ctx    Fetal position:     vertex    Taking prenatal vits?   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

## 2010-08-30 NOTE — Miscellaneous (Signed)
Summary: Procedures Consent  Procedures Consent   Imported By: De Nurse 06/09/2010 13:59:14  _____________________________________________________________________  External Attachment:    Type:   Image     Comment:   External Document

## 2010-08-30 NOTE — Assessment & Plan Note (Signed)
Summary: EDD 03/23/10   Prenatal Visit EDC Confirmation:    New working [facility]: 03/23/2010 Ultrasound Dating Information:    First U/S on 09/14/2009   Gest age: 29 and 6/7   EDC: 03/23/2010.

## 2010-08-30 NOTE — Assessment & Plan Note (Signed)
Summary: Implanon/mj   Vital Signs:  Patient profile:   30 year old female Height:      60.25 inches Weight:      122.31 pounds BMI:     23.77 BSA:     1.52 Temp:     97.6 degrees F Pulse rate:   73 / minute BP sitting:   112 / 76  Vitals Entered By: Jone Baseman CMA (June 03, 2010 10:24 AM) CC: implanon Is Patient Diabetic? No Pain Assessment Patient in pain? no        Primary Care Provider:  Alvia Grove DO  CC:  implanon.  History of Present Illness: Here for Implanon insertion. Already reviewed risks and benefits with pt.  Consent obtained prior to procedure.   Habits & Providers  Alcohol-Tobacco-Diet     Alcohol drinks/day: 0     Tobacco Status: never     Cigarette Packs/Day: n/a  Current Medications (verified): 1)  Pre-Natal Formula  Tabs (Prenatal Multivit-Min-Fe-Fa) 2)  Ibuprofen 600 Mg Tabs (Ibuprofen) .Marland Kitchen.. 1 Pill By Mouth Q 8hours As Needed 3)  Flexeril 10 Mg Tabs (Cyclobenzaprine Hcl) .Marland Kitchen.. 1 Pill By Mouth At Bedtime  Allergies (verified): No Known Drug Allergies  Physical Exam  Additional Exam:  Pt supine with Left arm exposed and supported by the table. Inspected upper inner aspect of her left (non dominant) arm , insertion site identified and marked (approximately three fingerbreadths superior and lateral to the medial epicondyle of the humerus).  Sterile drape under the arm and insertion site cleaned with iodine swabs x 2. 25-gauge needle attached to 5 mL syringe was used to administer 5 cc's of  1% lidocaine which was injected into the dermis to raise a wheal along the planned track of the rod insertion needle   Confirmed that the rod was in the needle and Implanon was inserted, bevel up. Advanced needle in subdermal connective tissue while tenting up the skin to ensure proper placement. Needle was advanced to its full length, seal was broken on the applicator, turned obturator 90 degrees. Slowly removed the cannula and needle out of  arm,  rod under the skin.    Skin was palpated and both ends of rod were palpable, verified correct placement of the rod; Examine the needle, obturator visible, rod not present. Showed  patient how to palpate the rod,  then placed an adhesive closure and bandage over the insertion site. Patient Chart Label:   Insertion date: 06-03-10; 3 year removal date: 06-03-13. LEFT arm. Implanon was successfully placed. User Card filled out and  given to the patient.  Pt tolerated procedure well and had no complications. Advised to call if she develops pain, discharge, or swelling at the insertion site, or fever. Dr. Deirdre Priest was present and supervising during procedure .    Impression & Recommendations:  Problem # 1:  INSERTION OF IMPLANTABLE SUBDERMAL CONTRACEPTIVE (ICD-V25.5)  Orders: No Charge Patient Arrived (NCPA0) (NCPA0) Upreg neg. Consent obtained.  Procedure completed without complications.  Pt tolerated well.   Problem # 2:  BACK PAIN, LUMBAR (ICD-724.2) ongoing since second trimester.   see pt instructions Her updated medication list for this problem includes:    Ibuprofen 600 Mg Tabs (Ibuprofen) .Marland Kitchen... 1 pill by mouth q 8hours as needed    Flexeril 10 Mg Tabs (Cyclobenzaprine hcl) .Marland Kitchen... 1 pill by mouth at bedtime  Complete Medication List: 1)  Pre-natal Formula Tabs (Prenatal multivit-min-fe-fa) 2)  Ibuprofen 600 Mg Tabs (Ibuprofen) .Marland Kitchen.. 1 pill by mouth  q 8hours as needed 3)  Flexeril 10 Mg Tabs (Cyclobenzaprine hcl) .Marland Kitchen.. 1 pill by mouth at bedtime  Other Orders: [facility] (06301)  Patient Instructions: 1)  Great to see you today! 2)  If you have any pain or swelling in your left arm that lasts more than 24 hours, please call me. 3)  Do the stretchs for your low back pain. 4)  Take Ibuprofen 600mg  by mouth Q8 hours for 2 weeks for your back pain. 5)  Also take the flexeril one pill at night for muscle pain. 6)  If your pain is no better in 2 weeks, please come back and see me.    7)  Please schedule a follow-up appointment as needed .  Prescriptions: FLEXERIL 10 MG TABS (CYCLOBENZAPRINE HCL) 1 pill by mouth at bedtime  #30 x 5   Entered and Authorized by:   Alvia Grove DO   Signed by:   Alvia Grove DO on 06/03/2010   Method used:   Print then Give to Patient   RxID:   [id number] IBUPROFEN 600 MG TABS (IBUPROFEN) 1 pill by mouth Q 8hours as needed  #90 x 1   Entered and Authorized by:   Alvia Grove DO   Signed by:   Alvia Grove DO on 06/03/2010   Method used:   Print then Give to Patient   RxID:   [id number] FLEXERIL 10 MG TABS (CYCLOBENZAPRINE HCL) 1 pill by mouth at bedtime  #30 x 5   Entered and Authorized by:   Alvia Grove DO   Signed by:   Alvia Grove DO on 06/03/2010   Method used:   Print then Give to Patient   RxID:   [id number] IBUPROFEN 600 MG TABS (IBUPROFEN) 1 pill by mouth Q 8hours as needed  #90 x 1   Entered and Authorized by:   Alvia Grove DO   Signed by:   Alvia Grove DO on 06/03/2010   Method used:   Print then Give to Patient   RxID:   [id number]    Orders Added: 1)  [facility] [81025] 2)  No Charge Patient Arrived (NCPA0) [NCPA0]    Laboratory Results   Urine Tests  Date/Time Received: June 03, 2010 10:30 AM  Date/Time Reported: June 03, 2010 10:33 AM     Urine HCG: negative Comments: ...........test performed by...........Marland KitchenTerese Door, CMA

## 2010-08-30 NOTE — Assessment & Plan Note (Signed)
Summary: f/u knot under arm/eo   Vital Signs:  Patient profile:   30 year old female Height:      60.25 inches Weight:      130 pounds BMI:     25.27 BSA:     1.56 Temp:     99.0 degrees F Pulse rate:   82 / minute BP sitting:   107 / 71  Vitals Entered By: Jone Baseman CMA (April 07, 2010 2:50 PM) CC: f/u knot on arm Is Patient Diabetic? No Pain Assessment Patient in pain? yes     Location: left arm Intensity: 4   Primary Care Provider:  Alvia Grove DO  CC:  f/u knot on arm.  History of Present Illness: 30 yo female here for f/u of enlarging lump under her left axilla . Pt s/p NSVD about 11 days ago, and had her first baby.  Pt is breastfeeding and notes that area under axilla enlarges and produces milk in sequence with her breasts.  Did have a very small lump prior to delivery, but it was small and did not bother the pt.  Had it looked at in [place] about 5  yrs ago and was told it was a lipoma.  No redness, no pain, no discharge, but does note milk production when breasts are full.  No fevers.  Area is not bothering her but pt is concerned that it changes sizes esp due to the fact that it has become quite large now that she is breast feeding.  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 no hx of breast ca  Social History: moved from [place] about 2  yrs  ago Married Never Smoked Drug use-no Drug Use:  no  Review of Systems  The patient denies anorexia, fever, weight loss, weight gain, vision loss, decreased hearing, hoarseness, chest pain, syncope, dyspnea on exertion, peripheral edema, prolonged cough, headaches, hemoptysis, abdominal pain, melena, hematochezia, severe indigestion/heartburn, hematuria, incontinence, genital sores, muscle weakness, suspicious skin lesions, transient blindness, difficulty walking, depression, unusual weight change, abnormal bleeding, enlarged lymph nodes, angioedema, breast masses, and testicular masses.    Physical Exam  General:  vs reviewed, alert, well-developed, well-nourished, and well-hydrated.   Breasts:  slightly engorged, colostrum discharge, no abonormal skin or nipple changes  6 x 6 cm mass in left axilla without erythema or increased warmth, no pain with palpation, with expressible milky liquid from enlarged pore in center.  Lungs:  Normal respiratory effort, chest expands symmetrically. Lungs are clear to auscultation, no crackles or wheezes. Heart:  Normal rate and regular rhythm. S1 and S2 normal without gallop, murmur, click, rub or other extra sounds. Extremities:  No clubbing, cyanosis, edema, or deformity noted with normal full range of motion of all joints.   Neurologic:  alert & oriented X3 and DTRs symmetrical and normal.   Skin:  turgor normal and color normal.     Impression &  Recommendations:  Problem # 1:  LUMP OR MASS IN BREAST (ICD-611.72) Assessment Improved precepted with Dr Mauricio Po who has seen the pt for this twice in the past 2  weeks.   Enlargement due to mammory tissue extended to axilla, so due to breast feeding, area under axilla engorges along with breast engorgement.  No s/s of infection.   Likely will resolve when pt stops breast feeding.  Safe to continue breast feeding and encouraged to continue.  Reviewed s/s of infection and need for medical intervention if  infection occurs.  F/u in 2  weeks.  Complete Medication List: 1)  Pre-natal Formula Tabs (Prenatal multivit-min-fe-fa)  Other Orders: Postpartum visit- [facility] ([address])  Patient Instructions: 1)  Great to see you today!! 2)  Your little guy is so handsome! 3)  It is completely safe to continue breast feeding and I encourage you to do this as it is so good for baby! 4)  If you notice redness or have new pain, please RTC. 5)  Otherwise f/u in 2  weeks.

## 2010-08-30 NOTE — Miscellaneous (Signed)
Summary: bleeding  Clinical Lists Changes using interpreter, pt c/o bloody ,blackish discharge, small amount. no contractions. [place] was done [place]. 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 not this one

## 2010-08-30 NOTE — Progress Notes (Signed)
Summary: phone note  Phone Note Other Incoming   Caller: Husband Summary of Call: spoke with husband, bad reception, wife called him at work to say that she has not felt the baby move since Wed (unsure of last time she felt movement), unsure if she has take time to just observse and count movements, stated that baby is usually very active and that wife is comcerned, wife does not speak good english and that he would like apt, could only hear parts of the comversation due to bad service, sched work in apt at 130, aware of wait, interputor called, will try and call the wife,  Initial call taken by: Gladstone Pih,  February 18, 2010 11:59 AM  Follow-up for Phone Call        spoke with pt to get better info, friend was present to translate on the phone,  baby has not moved since yesterday at 10am, has tryed to count, no fluid leak, spoke with Dr Sheffield Slider, Dr Sheffield Slider spoke to pt in spanish and advised to go to [facility], apt cancelled , inter cancelled, to PCP Follow-up by: Gladstone Pih,  February 18, 2010 12:06 PM

## 2010-08-30 NOTE — Assessment & Plan Note (Signed)
Summary: ob/aam/kh   Vital Signs:  Patient profile:   30 year old female Height:      60.25 inches Weight:      142.7 pounds Pulse rate:   66 / minute BP sitting:   99 / 63  Vitals Entered By: Garen Grams LPN (February 10, 2010 2:12 PM)  Habits & Providers  Alcohol-Tobacco-Diet     Tobacco Status: never     Cigarette Packs/Day: n/a  Current Medications (verified): 1)  Pre-Natal Formula  Tabs (Prenatal Multivit-Min-Fe-Fa)  Allergies (verified): No Known Drug Allergies  Physical Exam  General:  Vs reviewed, alert, well-developed, well-nourished, and well-hydrated.   vs noted  Abdomen:  gravid Extremities:  2+ pitting edema symmetrically lower extremities   Impression & Recommendations:  Problem # 1:  SUPERVISION OF OTHER NORMAL PREGNANCY (ICD-V22.1) Assessment Unchanged  30yo G1P0 at 34.[redacted] weeks gestation, dated by first U/S at [redacted]W[redacted]D.  EDD 03-23-10 Initial labs: O+, Ab -, Hgb 11.1, Hct 32.7, Plt 259, HBsAg neg, Rubella immune, RPR NR, HIV NR, Sickle cell neg, GC/Chlamydia neg, PAP neg 1hrGTT: 169 3hr: [phone] Quad screen Normal Female gender  Follow up in 2 weeks.  overall doing well. will need GBS at next visit and consideration of Gc/Chlam. is having some pubic symphysis discomfort currently.   Orders: Other OB visit- [facility] ([facility])  Complete Medication List: 1)  Pre-natal Formula Tabs (Prenatal multivit-min-fe-fa)  Patient Instructions: 1)  Baby sounds great! 2)  If baby is not moving well, lay down in a quiet room and count how often baby is moving.  If it is less than 4 times in 1 hour, please go to [facility].   3)  If you have any bleeding or leaking or if you have regular contractions please go to [facility] hospital. 4)  Please follow up with Dr Gomez Cleverly or Dr Sandi Mealy in 2 weeks for your next check up.   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    Number of children at home: 2    [facility]  FOB Information    Husband/Father of baby: Tamala Bari    FOB Comments: not supportive   Menstrual History    LMP (date): 06/09/2009    LMP - Character: normal    Menarche: 16 years    Menses interval: very regular days    Menstrual flow 3-4 days    On BCP's at conception: no   Flowsheet View for Follow-up Visit    Estimated weeks of       gestation:     34 1/7    Weight:     142.7    Blood pressure:   99 / 63    Headache:     No    Nausea/vomiting:   No    Edema:     2+LE    Vaginal bleeding:   no    Vaginal discharge:   no    Fundal height:      34    FHR:       150s    Fetal activity:     yes    Labor symptoms:   no    Fetal position:     vertex    Taking prenatal vits?  Y    Smoking:     n/a    Next visit:     2 wk    Resident:     Sandi Mealy    Preceptor:     Chambliss

## 2010-08-30 NOTE — Assessment & Plan Note (Signed)
Summary: OB/AAM/KH   Vital Signs:  Patient profile:   30 year old female Weight:      135 pounds BP sitting:   107 / 63  Vitals Entered By: Jone Baseman CMA (Dec 14, 2009 3:11 PM)  Habits & Providers  Alcohol-Tobacco-Diet     Cigarette Packs/Day: n/a  Physical Exam  General:  VS reviewed, alert, well-developed, and well-nourished.   Lungs:  normal respiratory effort and normal breath sounds.   Heart:  normal rate and regular rhythm.   Abdomen:  gravid, consistent with dates Extremities:  no edema   Impression & Recommendations:  Problem # 1:  SUPERVISION OF OTHER NORMAL PREGNANCY (ICD-V22.1)  30yo G1P0 at 25.[redacted] weeks gestation, dated by first U/S at [redacted]W[redacted]D.  EDD 03-23-10 Initial labs: O+, Ab -, Hgb 11.1, Hct 32.7, Plt 259, HBsAg neg, Rubella immune, RPR NR, HIV NR, Sickle cell neg, GC/Chlamydia neg, PAP neg Quad screen Normal Female gender  Follow up in 2 weeks. Glucola, 28 week labs at next visit  Orders: Other OB visit- [facility] ([facility])  Problem # 2:  BACK PAIN, LUMBAR (ICD-724.2) Assessment: Improved Bought an elastic support band at [employer], has been using it daily and notes large improvemement of back pain.  Patient Instructions: 1)  Baby sounds great! 2)  Please see me in 2-3 weeks, please book for May 31st (ok to double book). 3)  I will check your sugar and get labs at your May 31st visit  Prenatal Visit Concerns noted: Back pain imporving with suppot band.  +FM   Flowsheet View for Follow-up Visit    Estimated weeks of       gestation:     25 6/7    Weight:     135    Blood pressure:   107 / 63    Headache:     No    Nausea/vomiting:   No    Edema:     0    Vaginal bleeding:   no    Vaginal discharge:   no    Fundal height:      26    FHR:       140-150s    Fetal activity:     yes    Labor symptoms:   no    Fetal position:     N/A    Taking prenatal vits?   Y    Smoking:     n/a    Next visit:     May 31    Resident:     Gomez Cleverly  Preceptor:     Chambliss      OB Ultrasound Data Entry   Ultrasound Date:     10/20/2009   Indication:       Fetal growth   Fetal number:       1              Fetal position:       Breech       Best Working [facility]:    03/23/2010

## 2010-08-30 NOTE — Assessment & Plan Note (Signed)
Summary: NOB/AAM/DSL   Vital Signs:  Patient profile:   30 year old female LMP:     06/09/2009 Height:      60.25 inches Weight:      117.8 pounds BMI:     22.90 Temp:     98.1 degrees F oral Pulse rate:   87 / minute BP sitting:   102 / 65  (left arm) Cuff size:   regular  Vitals Entered By: Gladstone Pih (September 06, 2009 8:39 AM) CC: NOB Is Patient Diabetic? No Pain Assessment Patient in pain? no      LMP (date): 06/09/2009 EDC by LMP==> 03/16/2010 LMP - Character: normal LMP - Reliable? No Menarche (age onset years): 16   Menses interval (days): very regular Menstrual flow (days): 3-4 On BCP's at conception: no Enter LMP: 06/09/2009   Primary Care Provider:  Magnus Ivan MD  CC:  NOB.  History of Present Illness: NOB:  this is patient's first continuity visit with me.  went over ob intake flowsheet.  pt is currently 12 5/[redacted] weeks pregnant by last menstrual period.    Habits & Providers  Alcohol-Tobacco-Diet     Tobacco Status: never     Cigarette Packs/Day: n/a  Social History: Smoking Status:  never Hepatitis Risk:  no Packs/Day:  n/a Occupation:  housekeeping part time Education:  high school  Physical Exam  General:  NAD vital signs noted and wnl Head:  normocephalic and atraumatic.   Eyes:  pupils equal, pupils round, and pupils reactive to light.   Ears:  R ear normal and L ear normal.  TM without erythema and landmarks visualized  Mouth:  pharynx pink and moist.   Neck:  no cervical lymphadenopathy.   Lungs:  normal respiratory effort.  CTAB, no wheezes, crackles, ronchi Heart:  II/VI murmur Abdomen:  no tenderness to palpation, hypoactive bowel sounds Genitalia:  normal introitus and vaginal discharge (white), normal introitus, no external lesions, no vaginal or cervical lesions, no vaginal atrophy, no adnexal masses or tenderness, and vaginal discharge.   Pulses:  R radial normal and L radial normal.  2+ Neurologic:  cranial nerves  II-XII intact, strength normal in all extremities, and sensation intact to light touch.   Psych:  normally interactive and good eye contact.     Impression & Recommendations:  Problem # 1:  SUPERVISION OF OTHER NORMAL PREGNANCY (ICD-V22.1) Assessment New On day of clinic presentation, as per last menstrual period pt is 12 5/[redacted] weeks pregnant but she is unsure of dating and ordered an anatomy scan for appropriate aging.  Went through intake exam and no concerns.  Collected cultures, did wet prep and did pap smear.  Only concern was yeast infection, consistent with reported symptoms from patient.  Gave patient flu shot on day of clinic visit.  Advised pt to see Jaynee Eagles for financial assistance.  Also, told patient about genetic screening exams that can be done for her (integrated screen and quad screen). Orders: GC/Chlamydia-[facility] ([id number]) Pap Smear-[facility] ([id number]) Wet Prep- [facility] ([id number]) Other OB visit- [facility] (OBCK) Prenatal U/S < 14 weeks - 29562  (Prenatal U/S)  Problem # 2:  VAGINAL DISCHARGE (ICD-623.5) Assessment: New As noted on wet prep and also on pap smear results, pt has a yeast infection.  Will call patient (through interpreter) and tell her to take OTC Monistat for symptoms and will follow up with improvement of symptoms at next visit and will advise patient to come back sooner if symptoms do not get  resolve by the end of medication course. [phone])  Other Orders: Influenza Vaccine NON MCR (56213)  Patient Instructions: 1)  We will schedule an anatomy scan to make sure we know the age of your baby.  We will call for your appointment. 2)  Please go see Jaynee Eagles for financial assistance 3)  You will get your flu shot today. 4)  See you in a month! 5)  Thank you and be blessed! 6)  Dr. Magnus Ivan   Flowsheet View for Follow-up Visit    Estimated weeks of       gestation:     12 5/7    Weight:     117.8    Blood pressure:   102 / 65    Hx  headache?     No    Nausea/vomiting?   No    Edema?     0    Bleeding?     no    Leakage/discharge?   thick white, itches, with odor    Taking Vitamins?   Y    Smoking PPD:   n/a    Next visit:     4 wk    Preceptor:     Dr. Swaziland  Prenatal Visit    FOB name: [facility]ala Bari [facility] Confirmation:    LMP reliable? No    Last menses onset (LMP) date: 06/09/2009    EDC by LMP: 03/16/2010    Genetic History     Thalassemia:     mother: no    Neural tube defect:   mother: no    Down's Syndrome:   mother: no    Tay-Sachs:     mother: no    Sickle Cell Dz/Trait:   mother: no    Hemophilia:     mother: no    Muscular Dystrophy:   mother: no    Cystic Fibrosis:   mother: no    Huntington's Dz:   mother: no    Mental Retardation:   mother: no    Fragile X:     mother: no    Other Genetic or       Chromosomal Dz:   mother: no    Child with other       birth defect:     mother: no    > 3 spont. abortions:   mother: no    Hx of stillbirth:     mother: no  Additional Genetic Comments:    no genetic concerns in patient or family as above  Infection Risk History    High Risk Hepatitis B: no    Immunized against Hepatitis B: yes    Exposure to TB: no    Patient with history of Genital Herpes: no    Sexual partner with history of Genital Herpes: no    History of STD (GC, Chlamydia, Syphilis, HPV): no    Rash, Viral, or Febrile Illness since LMP: yes    Exposure to Cat Litter: no    Chicken Pox Immune Status: Hx of Vaccine    History of Parvovirus (Fifth Disease): no    Occupational Exposure to Children: other  Environmental Exposures    Xray Exposure since LMP: no    Chemical or other exposure: no    Medication, drug, or alcohol use since LMP: no  Additional Infection/Environmental Comments:    rash, viral, febrile illness since last menstrual period: 1 day with fever and a few colds childern exposure:  lives  with 2 nephews   Flowsheet View for Follow-up Visit    Estimated  weeks of       gestation:     12 5/7    Weight:     117.8    Blood pressure:   102 / 65    Headache:     No    Nausea/vomiting:   No    Edema:     0    Vaginal bleeding:   no    Vaginal discharge:   thick white, itches, with odor    Taking prenatal vits?   Y    Smoking:     n/a    Next visit:     4 wk    Preceptor:     Dr. 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 : No    Menarche: 16 years    Menses interval: very regular days    Menstrual flow 3-4 days    On BCP's at conception: no    Symptoms since LMP: amenorrhea, fatigue, irritability, bloating   Laboratory Results  Date/Time Received: September 06, 2009 9:35 AM  Date/Time Reported: September 06, 2009 9:49 AM   [employer] [place]: VAGINAL WBC/hpf: 5-10 Bacteria/hpf: 3+  Rods Clue cells/hpf: none  Negative whiff Yeast/hpf: moderate Trichomonas/hpf: none Comments: ...........test performed by...........Marland KitchenTerese Door, CMA      Immunizations Administered:  Influenza Vaccine # 1:    Vaccine Type: Fluvax Non-MCR    Site: left deltoid    Mfr: GlaxoSmithKline    Dose: 0.5 ml    Route: IM    Given by: Loralee Pacas CMA    Exp. Date: 01/27/2010    Lot #: [id number]    VIS given: 03/09/2009  Flu Vaccine Consent Questions:    Do you have a history of severe allergic reactions to this vaccine? no    Any prior history of allergic reactions to egg and/or gelatin? no    Do you have a sensitivity to the preservative Thimersol? no    Do you have a past history of Guillan-Barre Syndrome?  no    Do you currently have an acute febrile illness? no    Have you ever had a severe reaction to latex? no    Vaccine information given and explained to patient? yes    Are you currently pregnant? yes     Appended Document: NOB/AAM/DSL fetal heart tones heard.  measured FHR=150's

## 2010-08-30 NOTE — Assessment & Plan Note (Signed)
Summary: ob visit,tcb   Vital Signs:  Patient profile:   30 year old female Height:      60.25 inches Weight:      144 pounds BMI:     27.99 Temp:     98.4 degrees F oral Pulse rate:   85 / minute BP sitting:   104 / 70  (right arm) Cuff size:   regular  Vitals Entered By: Jimmy Footman, CMA (March 03, 2010 3:11 PM) CC: ob visit 37  1/7 Is Patient Diabetic? No Pain Assessment Patient in pain? yes        CC:  ob visit 37  1/7.  Habits & Providers  Alcohol-Tobacco-Diet     Tobacco Status: never     Cigarette Packs/Day: n/a  Allergies: No Known Drug Allergies  Physical Exam  General:  VS reviewed, alert, well-developed, well-nourished, and well-hydrated.   Lungs:  Normal respiratory effort, chest expands symmetrically. Lungs are clear to auscultation, no crackles or wheezes. Heart:  Normal rate and regular rhythm. S1 and S2 normal without gallop, murmur, click, rub or other extra sounds. Abdomen:  gravid Extremities:  trace edema bilateral LE   Impression & Recommendations:  Problem # 1:  SUPERVISION OF OTHER NORMAL PREGNANCY (ICD-V22.1)  Precepted with Dr. Mauricio Po Bedside u/s confirmed vertex position.   30yo G1P0 at 37.[redacted] weeks gestation, dated by first U/S at [redacted]W[redacted]D.  EDD 03-23-10 Initial labs: O+, Ab -, Hgb 11.1, Hct 32.7, Plt 259, HBsAg neg, Rubella immune, RPR NR, HIV NR, Sickle cell neg, GC/Chlamydia neg, GBS neg 1hrGTT: 169 3hr: [phone] Quad screen Normal Female gender, plans to breast feed. Discussed pain management options for labor.  Pt will think about it and we will discuss further next week.   f/u next week.    discussed back pain she is having - likely related to pregnancy.    Orders: Other OB visit- [facility] (OBCK)  Complete Medication List: 1)  Pre-natal Formula Tabs (Prenatal multivit-min-fe-fa)  Patient Instructions: 1)  Nice to see you! 2)  If you have any bleeding or leaking please go to [facility] hospital 3)  Continue taking your  prenatal vitamins daily 4)  Your next appointment is 03-09-10 @ 3:45pm   Flowsheet View for Follow-up Visit    Estimated weeks of       gestation:     37 1/7    Weight:     144    Blood pressure:   104 / 70    Hx headache?     No    Nausea/vomiting?   No    Edema?     TrLE    Bleeding?     no    Leakage/discharge?   d/c    Fetal activity:       yes    Fundal height:      36    FHR:       140-150    Fetal position:      vertex    Cx dilation:     1    Cx effacement:   50%    Fetal station:     -3    Taking Vitamins?   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  Good fetal movement, no leaking or bleeding.

## 2010-09-12 ENCOUNTER — Encounter: Payer: Self-pay | Admitting: *Deleted

## 2010-10-14 LAB — RPR: RPR Ser Ql: NONREACTIVE

## 2010-10-14 LAB — CBC
MCH: 28.2 pg (ref 26.0–34.0)
MCHC: 33.1 g/dL (ref 30.0–36.0)
MCHC: 34.1 g/dL (ref 30.0–36.0)
MCV: 82.8 fL (ref 78.0–100.0)
Platelets: 184 10*3/uL (ref 150–400)
RBC: 4.2 MIL/uL (ref 3.87–5.11)
RDW: 14.9 % (ref 11.5–15.5)
WBC: 13.9 10*3/uL — ABNORMAL HIGH (ref 4.0–10.5)
WBC: 9.1 10*3/uL (ref 4.0–10.5)

## 2010-10-17 LAB — GLUCOSE, CAPILLARY
Glucose-Capillary: 73 mg/dL (ref 70–99)
Glucose-Capillary: 169 mg/dL — ABNORMAL HIGH (ref 70–99)

## 2012-12-11 ENCOUNTER — Telehealth: Payer: Self-pay | Admitting: General Practice

## 2012-12-11 NOTE — Telephone Encounter (Signed)
Call back when pap equipment ready in order to set-up appt.

## 2012-12-13 ENCOUNTER — Ambulatory Visit: Payer: No Typology Code available for payment source | Attending: Family Medicine | Admitting: Family Medicine

## 2012-12-13 VITALS — BP 104/70 | HR 61 | Temp 98.5°F | Resp 17 | Ht <= 58 in | Wt 121.0 lb

## 2012-12-13 DIAGNOSIS — N63 Unspecified lump in unspecified breast: Secondary | ICD-10-CM | POA: Insufficient documentation

## 2012-12-13 DIAGNOSIS — D242 Benign neoplasm of left breast: Secondary | ICD-10-CM

## 2012-12-13 DIAGNOSIS — D249 Benign neoplasm of unspecified breast: Secondary | ICD-10-CM

## 2012-12-13 NOTE — Patient Instructions (Signed)
Auto Examen [employer] (Breast Self-Examination) Deber comenzar con el examen de sus mamas a los 20 aos incluso cuando el riesgo de cncer es bajo a esa edad. Es importante familiarizarse acerca de cmo sus pechos se ven y se sienten. Esto tambin es necesario en mujeres embarazadas, en etapa de amamantamiento, con menopausia o con implantes.  Las mujeres deben examinar sus pechos una vez al mes para observar cambios y bultos. Al realizar los [employer], se llega a Microbiologist en que sus senos se sienten y [place] de un mes a Therapist, art. Esto le permite descubrir cambios de manera precoz. Este autoexamen [employer] ofrece la tranquilidad de que sus senos estn en buen [place]. Este examen slo le tomar algunos minutos y Health and safety inspector aos a su vida. La mayor parte de los bultos en la mama no son cancerosos. Si encuentra un bulto, un tipo de radiografas llamado mamografa u otras pruebas sern necesarias para determinar qu es lo que est mal.  Algunos de los sntomas de que un bulto en el pecho est causado por un cncer son:  Hundimiento de la piel o cambios en la forma del pecho o pezn.   Supuracin oscura o sanguinolenta del pezn.   Ganglios linfticos inflamados alrededor del pecho o en la axila.   Enrojecimiento de la mama o pezn.   Descamacin del pezn o la piel del pecho.   Dolor o inflamacin de la mama.  AUTOEXAMEN  Hay algunas indicaciones que debe seguir para Charity fundraiser minucioso. El mejor momento para examinar las mamas es 5 a 7 das despus que ha finalizado el perodo menstrual. Durante los aos en que se [place], es mejor examinar las mamas uno o [facility] despus del perodo menstrual. Durante la menstruacin, las mamas estn ms abultadas y puede haber ms dificultad para Clinical research associate modificaciones. Si no menstra, se encuentra en la menopausia o ha sufrido una histerectoma, Orthoptist de cada mes. Luego de tres o cuatro meses se  familiarizar con las variaciones y se sentir ms cmoda para Horticulturist, commercial.  Realice un examen mensualmente. Debe llevar un registro escrito con los cambios o los hallazgos normales que encuentre para cada seno. Mudlogger segura de los cambios y no depender slo de la memoria para el [place], las molestias o la ubicacin de bultos. Trate de Horticulturist, commercial en el mismo momento cada mes y registrar cuando tiene su perodo menstrual, si an lo tiene.   Observe sus mamas. Prese frente a un espejo con las manos tomadas detrs de la [place]. Tense los msculos del pecho y busque asimetras. Esto significa diferencias en la forma o en el contorno de un seno al otro, tales como arrugas, hoyuelos o bultos. Tambin busque cambios en la piel.   Reclnese hacia delante con sus manos sobre las caderas Una vez ms busque asimetras y cambios en la piel.   Mientras se ducha, jabnese y palpe cuidadosamente los senos con la yema de los dedos mientras sostiene el brazo (del lado del seno que es examinado) sobre la cabeza. Hgalo con cada seno cuidadosamente, buscando bultos o modificaciones. Generalmente deber emplear un movimiento circular con Lacretia Nicks presin de los dedos.   Repita este examen mientras est recostada sobre la [place], una vez ms con el brazo sobre la cabeza y una almohada bajo los hombros. [place] use las yemas de los dedos para examinar ambas mamas, buscando bultos y engrosamientos. Comience  en la zona de la hora 1 y contine en el sentido de las agujas del reloj por toda la mama.   Al terminar el examen, pellizque suavemente cada pezn para ver si hay secrecin. Observe cambios en los pezones, hundimientos o enrojecimiento.   Finalmente examine la parte superior del trax, la zona de las clavculas y [place].  No es necesario alarmarse si encuentra un bulto. La mayor parte son benignos (no cancerosos). Pero ser necesario consultar con el profesional que la  asiste para que pueda evaluarlo. Document Released: 07/17/2005 Document Revised: 03/29/2011 [facility] Patient Information 2012 [place], [place].Lipoma A lipoma is a noncancerous (benign) tumor composed of fat cells. They are usually found under the skin (subcutaneous). A lipoma may occur in any tissue of the body that contains fat. Common areas for lipomas to appear include the back, shoulders, buttocks, and thighs. Lipomas are a very common soft tissue growth. They are soft and grow slowly. Most problems caused by a lipoma depend on where it is growing. DIAGNOSIS  A lipoma can be diagnosed with a physical exam. These tumors rarely become cancerous, but radiographic studies can help determine this for certain. Studies used may include:  Computerized X-ray scans (CT or CAT scan).  Computerized magnetic scans (MRI). TREATMENT  Small lipomas that are not causing problems may be watched. If a lipoma continues to enlarge or causes problems, removal is often the best treatment. Lipomas can also be removed to improve appearance. Surgery is done to remove the fatty cells and the surrounding capsule. Most often, this is done with medicine that numbs the area (local anesthetic). The removed tissue is examined under a microscope to make sure it is not cancerous. Keep all follow-up appointments with your caregiver. SEEK MEDICAL CARE IF:   The lipoma becomes larger or hard.  The lipoma becomes painful, red, or increasingly swollen. These could be signs of infection or a more serious condition. Document Released: 07/07/2002 Document Revised: 10/09/2011 Document Reviewed: 12/17/2009 [facility] Patient Information 2013 [place], [place].

## 2012-12-13 NOTE — Progress Notes (Signed)
Patient has a cyst under left arm Also complains of some back pain

## 2012-12-13 NOTE — Progress Notes (Signed)
Subjective:     Patient ID: Jacqueline Walter, female   DOB: 1980-09-25, 32 y.o.   MRN: [id number] Spanish Translator used to communicate with patient   HPI Pt reporting that a longstanding lump mass under left axillary area  Has been present for a long time and she wants to have it removed.  Pt is reporting that she was told in [place] that it was a lipoma.  Pt says that it has been leaking breast milk in the past when she was pregnant.   She would like to see a surgeon to have it removed.    Review of Systems Constitutional: Negative.  HENT: Negative.  Respiratory: Negative.  Cardiovascular: Negative.  Gastrointestinal: Negative.  Endocrine: Negative.  Genitourinary: Negative.  Musculoskeletal: Negative.  Skin: Negative.  Allergic/Immunologic: Negative.  Neurological: Negative.  Hematological: Negative.  Psychiatric/Behavioral: Negative.  All other systems reviewed and are negative      Objective:   Physical Exam  Nursing note and vitals reviewed. Constitutional: She appears well-developed and well-nourished. No distress.  HENT:  Head: Normocephalic and atraumatic.  Eyes: EOM are normal. Pupils are equal, round, and reactive to light.  Neck: Neck supple.  Cardiovascular: Normal rate.   Pulmonary/Chest: Effort normal and breath sounds normal.         Assessment:     Lump in tail area of left breast ( possibly this is a lipoma) or extraneous breast tissue, it has been present for many years and gets larger when pt is pregnant and has leaked milk when she was pregnant     Plan:        Lump or mass in breast   Refer to OBGYN and refer for general surgery to have this extraneous tissue evaluated and removed. Pt doesn't have an orange card and will likely have to pay out of pocket to have it removed. The patient verbalized understanding.   The patient was given clear instructions to go to ER or return to medical center if symptoms don't improve, worsen or new problems  develop.  The patient verbalized understanding.  The patient was told to call to get lab results if they haven't heard anything in the next week.    followup in 1 week for recheck and CPE   Rodney Langton, MD, CDE, FAAFP [facility] [place], [place]

## 2012-12-14 ENCOUNTER — Encounter: Payer: Self-pay | Admitting: Family Medicine

## 2012-12-20 ENCOUNTER — Ambulatory Visit: Payer: Self-pay

## 2013-01-10 ENCOUNTER — Ambulatory Visit (INDEPENDENT_AMBULATORY_CARE_PROVIDER_SITE_OTHER): Payer: Self-pay | Admitting: Surgery

## 2013-01-17 ENCOUNTER — Ambulatory Visit (INDEPENDENT_AMBULATORY_CARE_PROVIDER_SITE_OTHER): Payer: PRIVATE HEALTH INSURANCE | Admitting: Surgery

## 2013-01-17 ENCOUNTER — Encounter: Payer: No Typology Code available for payment source | Admitting: Obstetrics & Gynecology

## 2013-01-28 ENCOUNTER — Telehealth (INDEPENDENT_AMBULATORY_CARE_PROVIDER_SITE_OTHER): Payer: Self-pay

## 2013-01-28 ENCOUNTER — Ambulatory Visit (INDEPENDENT_AMBULATORY_CARE_PROVIDER_SITE_OTHER): Payer: PRIVATE HEALTH INSURANCE | Admitting: Surgery

## 2013-01-28 NOTE — Telephone Encounter (Signed)
Called pt to inquire as to why she had missed her appointment with Dr. Jamey Ripa today @ 250pm.  Through Elane Fritz (translation) pt stated that she thought her appt was scheduled for a different day.  Appt rescheduled for July 18 @ 950am

## 2013-02-14 ENCOUNTER — Encounter (INDEPENDENT_AMBULATORY_CARE_PROVIDER_SITE_OTHER): Payer: Self-pay | Admitting: Surgery

## 2013-02-14 ENCOUNTER — Ambulatory Visit (INDEPENDENT_AMBULATORY_CARE_PROVIDER_SITE_OTHER): Payer: PRIVATE HEALTH INSURANCE | Admitting: Surgery

## 2013-02-14 VITALS — BP 102/60 | HR 72 | Resp 14 | Ht 60.0 in | Wt 123.0 lb

## 2013-02-14 DIAGNOSIS — Q838 Other congenital malformations of breast: Secondary | ICD-10-CM

## 2013-02-14 DIAGNOSIS — Q831 Accessory breast: Secondary | ICD-10-CM

## 2013-02-14 NOTE — Patient Instructions (Signed)
We will try to schedule surgery to remove the lump in your left armpit.

## 2013-02-14 NOTE — Progress Notes (Signed)
NAME: Jacqueline Walter DOB: 06/16/1981 MRN: [id number]                                                                                      DATE: 02/14/2013  PCP: No PCP Per Patient Referring Provider: Cleora Fleet, MD  IMPRESSION:  Accessory breast tissue, left axilla, symptomatic when pregnant with production of milk  PLAN:   I discussed surgical excision versus followup. This is benign, but I think if she has another pregnancy, which she plans, she will have additional problems with it with discomfort and milk production.                  CC:  Chief Complaint  Patient presents with  . Breast Mass    left    HPI:  Jacqueline Walter is a 32 y.o.  female who presents for evaluation of a left breast/axillary mass. This is been present for quite a while. When she was pregnant it got larger actually produced milk. It has gotten smaller after she sees producing milk. Her last baby 66 years old. She does intend to get pregnant again. She is interested in having this removed wishes to have additional problems with her next pregnancy.  PMH:  has no past medical history on file.  PSH:   has no past surgical history on file.  ALLERGIES:  No Known Allergies  MEDICATIONS: No current outpatient prescriptions on file.  ROS: She has filled out our 12 point review of systems and it is negative . EXAM:   VITAL SIGNS:  BP 102/60  Pulse 72  Resp 14  Ht 5' (1.524 m)  Wt 123 lb (55.792 kg)  BMI 24.02 kg/m2  GENERAL:  The patient is alert, oriented, and generally healthy-appearing, NAD. Mood and affect are normal.  HEENT:  The head is normocephalic, the eyes nonicteric, the pupils were round regular and equal. EOMs are normal. Pharynx normal. Dentition good.  NECK:  The neck is supple and there are no masses or thyromegaly.  LUNGS: Normal respirations and clear to auscultation.  HEART: Regular rhythm, with no murmurs rubs or gallops. Pulses are intact carotid dorsalis pedis and  posterior tibial. No significant varicosities are noted.  BREASTS:  the breasts are symmetric. No breast mass per se. There is no skin, nipple, or other abnormalities noted.   AXILLA: There is no mass or other abnormality in the right axilla. There is a soft mass in the left axilla consistent with axillary accessory breast tissue. It is nontender. It is somewhat mobile, not fixed. I did not appreciate a nipple on the skin.  ABDOMEN: Soft, flat, and nontender. No masses or organomegaly is noted. No hernias are noted. Bowel sounds are normal.  EXTREMITIES:  Good range of motion, no edema.   DATA REVIEWED:  I have reviewed the notes from her visit to her primary care that are recorded in the electronic medical record    Mariaisabel Bodiford J 02/14/2013  CC: Cleora Fleet, MD, No PCP Per Patient

## 2013-02-21 ENCOUNTER — Ambulatory Visit (INDEPENDENT_AMBULATORY_CARE_PROVIDER_SITE_OTHER): Payer: No Typology Code available for payment source | Admitting: Family Medicine

## 2013-02-21 ENCOUNTER — Encounter: Payer: Self-pay | Admitting: Family Medicine

## 2013-02-21 VITALS — BP 101/59 | HR 76 | Temp 97.3°F | Ht 61.0 in | Wt 123.0 lb

## 2013-02-21 DIAGNOSIS — Z01419 Encounter for gynecological examination (general) (routine) without abnormal findings: Secondary | ICD-10-CM

## 2013-02-21 NOTE — Progress Notes (Signed)
Patient ID: Jacqueline Walter, female   DOB: September 08, 1980, 32 y.o.   MRN: [id number]   S:  32 y.o. G1P1 here for pap smear. Last pap 3 years ago during her pregnancy. Thinks she may have had an abnormal pap in the past, possibly the one during pregnancy, but no follow up done.  G1P1 - SVD 3 years ago. Today c/o white vaginal discharge, no itching or burning.  No dysuria, pelvic pain, abdominal, urinary or bowel problems. Has Nexplanon since Sept 2011.  Does not have regular periods just occasional spotting (last was one month ago) with some monthly cramping. Also has an abnormal mammary gland in L axilla for which she is scheduled for surgery on 8/20.   Past Medical History  Diagnosis Date  . Lump of breast, left 2014    being looked over at [facility].   . Abnormal Pap smear    History reviewed. No pertinent past surgical history.  History   Social History  . Marital Status: Married    Spouse Name: N/A    Number of Children: N/A  . Years of Education: N/A   Occupational History  . Not on file.   Social History Main Topics  . Smoking status: Never Smoker   . Smokeless tobacco: Never Used  . Alcohol Use: No  . Drug Use: No  . Sexually Active: Yes   Other Topics Concern  . Not on file   Social History Narrative  . No narrative on file   Family History  Problem Relation Age of Onset  . Colon cancer Maternal Grandmother   . Cancer Maternal Grandmother     unknown   MEDICATIONS:  None  No Known Allergies  ROS:  See HPI  OBJECTIVE: Filed Vitals:   02/21/13 0922  BP: 101/59  Pulse: 76  Temp: 97.3 F (36.3 C)    GEN:  WNWD, no distress HEENT:  NCAT, EOMI, conjunctiva clear NECK:  Supple, non-tender, no thyromegaly, trachea midline Breasts:  No lumps or masses, no nipple discharge or retraction, normal skin CV: RRR, no murmur RESP:  CTAB ABD:  Soft, non-tender, no guarding or rebound, normal bowel sounds EXTREM:  Warm, well perfused, no edema or  tenderness NEURO:  Alert, oriented, no focal deficits GU:  Normal external genitalia and vagina, no discharge. Normal parous os, no CMT, uterus normal size, contour, mobility. No adnexal masses or tenderness. Pap done with some bleeding.  A/P 32 y.o. G1P1 here for well-woman exam.  - Pap with HPV co-testing done per ASCCP guidelines. - Co-testing in 5 years if normal - Discussed recommendation for yearly well-woman exam for health maintenance - Follow up with breast surgery as scheduled.

## 2013-02-21 NOTE — Patient Instructions (Signed)
Pap Test A Pap test is a procedure done in a clinic office to evaluate cells that are on the surface of the cervix. The cervix is the lower portion of the uterus and upper portion of the vagina. For some women, the cervical region has the potential to form cancer. With consistent evaluations by your caregiver, this type of cancer can be prevented.  If a Pap test is abnormal, it is most often a result of a previous exposure to human papillomavirus (HPV). HPV is a virus that can infect the cells of the cervix and cause dysplasia. Dysplasia is where the cells no longer look normal. If a woman has been diagnosed with high-grade or severe dysplasia, they are at higher risk of developing cervical cancer. People diagnosed with low-grade dysplasia should still be seen by their caregiver because there is a small chance that low-grade dysplasia could develop into cancer.  LET YOUR CAREGIVER KNOW ABOUT:  Recent sexually transmitted infection (STI) you have had.  Any new sex partners you have had.  History of previous abnormal Pap tests results.  History of previous cervical procedures you have had (colposcopy, biopsy, loop electrosurgical excision procedure [LEEP]).  Concerns you have had regarding unusual vaginal discharge.  History of pelvic pain.  Your use of birth control. BEFORE THE PROCEDURE  Ask your caregiver when to schedule your Pap test. It is best not to be on your period if your caregiver uses a wooden spatula to collect cells or applies cells to a glass slide. Newer techniques are not so sensitive to the timing of a menstrual cycle.  Do not douche or have sexual intercourse for 24 hours before the test.   Do not use vaginal creams or tampons for 24 hours before the test.   Empty your bladder just before the test to lessen any discomfort.  PROCEDURE You will lie on an exam table with your feet in stirrups. A warm metal or plastic instrument (speculum) is placed in your vagina.  This instrument allows your caregiver to see the inside of your vagina and look at your cervix. A small, plastic brush or wooden spatula is then used to collect cervical cells. These cells are placed in a lab specimen container. The cells are looked at under a microscope. A specialist will determine if the cells are normal.  AFTER THE PROCEDURE Make sure to get your test results.If your results come back abnormal, you may need further testing.  Document Released: 10/07/2002 Document Revised: 10/09/2011 Document Reviewed: 07/13/2011 [facility] Patient Information 2014 [place], [place].  HPV Test The HPV (human papillomavirus) test is used to screen for high-risk types with HPV infection. HPV is a group of about 100 related viruses, of which 40 types are genital viruses. Most HPV viruses cause infections that usually resolve without treatment within 2 years. Some HPV infections can cause skin and genital warts (condylomata). HPV types 16, 18, 31 and 45 are considered high-risk types of HPV. High-risk types of HPV do not usually cause visible warts, but if untreated, may lead to cancers of the outlet of the womb (cervix) or anus. An HPV test identifies the DNA (genetic) strands of the HPV infection. Because the test identifies the DNA strands, the test is also referred to as the HPV DNA test. Although HPV is found in both males and females, the HPV test is only used to screen for cervical cancer in females. This test is recommended for females:  With an abnormal Pap test.  After treatment  of an abnormal Pap test.  Aged 31 and older.  After treatment of a high-risk HPV infection. The HPV test may be done at the same time as a Pap test in females over the age of 35. Both the HPV and Pap test require a sample of cells from the cervix. PREPARATION FOR TEST  You may be asked to avoid douching, tampons, or vaginal medicines for 48 hours before the HPV test. You will be asked to urinate before the test. For  the HPV test, you will need to lie on an exam table with your feet in stirrups. A spatula will be inserted into the vagina. The spatula will be used to swab the cervix for a cell and mucus sample. The sample will be further evaluated in a lab under a microscope. NORMAL FINDINGS  Normal: High-risk HPV is not found.  Ranges for normal findings may vary among different laboratories and hospitals. You should always check with your doctor after having lab work or other tests done to discuss the meaning of your test results and whether your values are considered within normal limits. MEANING OF TEST An abnormal HPV test means that high-risk HPV is found. Your caregiver may recommend further testing. Your caregiver will go over the test results with you. He or she will and discuss the importance and meaning of your results, as well as treatment options and the need for additional tests, if necessary. OBTAINING THE RESULTS  It is your responsibility to obtain your test results. Ask the lab or department performing the test when and how you will get your results. Document Released: 08/11/2004 Document Revised: 10/09/2011 Document Reviewed: 04/26/2005 [facility] Patient Information 2014 [place], [place].   You are being Co-tested for abnormal cells on your cervix AND the presence of high-risk HPV virus. If these tests are normal, you will receive a letter. You will need repeat co-testing with Pap and HPV test in 5 years. If your results are abnormal, we will call you and recommend further work-up accordingly. It is still recommended that you have a well-woman, health maintenance exam yearly even without pap testing.

## 2013-02-26 ENCOUNTER — Telehealth: Payer: Self-pay | Admitting: Obstetrics and Gynecology

## 2013-02-26 NOTE — Telephone Encounter (Addendum)
Message copied by Toula Moos on Wed Feb 26, 2013  1:51 PM ------ called patient using Tobi Bastos as interpreter. Gave result of pap smear. Patient satisfied.       Message from: FERRY, [place]      Created: Tue Feb 25, 2013 12:06 PM       Negative pap. Please send letter. Thanks. ------

## 2013-03-13 ENCOUNTER — Encounter (HOSPITAL_BASED_OUTPATIENT_CLINIC_OR_DEPARTMENT_OTHER): Payer: Self-pay | Admitting: *Deleted

## 2013-03-17 ENCOUNTER — Other Ambulatory Visit (INDEPENDENT_AMBULATORY_CARE_PROVIDER_SITE_OTHER): Payer: Self-pay | Admitting: Surgery

## 2013-03-19 ENCOUNTER — Ambulatory Visit (HOSPITAL_BASED_OUTPATIENT_CLINIC_OR_DEPARTMENT_OTHER)
Admission: RE | Admit: 2013-03-19 | Discharge: 2013-03-19 | Disposition: A | Discharge status: Home / Self Care | Payer: No Typology Code available for payment source | Source: Ambulatory Visit | Attending: Surgery | Admitting: Surgery

## 2013-03-19 ENCOUNTER — Encounter (HOSPITAL_BASED_OUTPATIENT_CLINIC_OR_DEPARTMENT_OTHER): Payer: Self-pay | Admitting: Anesthesiology

## 2013-03-19 ENCOUNTER — Encounter (HOSPITAL_BASED_OUTPATIENT_CLINIC_OR_DEPARTMENT_OTHER)
Admission: RE | Disposition: A | Discharge status: Home / Self Care | Payer: Self-pay | Source: Ambulatory Visit | Attending: Surgery

## 2013-03-19 ENCOUNTER — Encounter (HOSPITAL_BASED_OUTPATIENT_CLINIC_OR_DEPARTMENT_OTHER): Payer: Self-pay | Admitting: *Deleted

## 2013-03-19 ENCOUNTER — Ambulatory Visit (HOSPITAL_BASED_OUTPATIENT_CLINIC_OR_DEPARTMENT_OTHER): Payer: No Typology Code available for payment source | Admitting: Anesthesiology

## 2013-03-19 DIAGNOSIS — Q838 Other congenital malformations of breast: Secondary | ICD-10-CM | POA: Insufficient documentation

## 2013-03-19 DIAGNOSIS — Q831 Accessory breast: Secondary | ICD-10-CM

## 2013-03-19 HISTORY — PX: MASS EXCISION: SHX2000

## 2013-03-19 HISTORY — DX: Unspecified lump in the left breast, unspecified quadrant: N63.20

## 2013-03-19 SURGERY — EXCISION MASS
Anesthesia: General | Site: Breast | Laterality: Left | Wound class: Clean

## 2013-03-19 MED ORDER — LACTATED RINGERS IV SOLN
INTRAVENOUS | Status: DC
  Administered 2013-03-19 (×2): via INTRAVENOUS

## 2013-03-19 MED ORDER — BUPIVACAINE HCL (PF) 0.25 % IJ SOLN
INTRAMUSCULAR | Status: DC | PRN
  Administered 2013-03-19: 20 mL

## 2013-03-19 MED ORDER — CHLORHEXIDINE GLUCONATE 4 % EX LIQD: 1.0000 "application " | Freq: Once | CUTANEOUS | Status: DC

## 2013-03-19 MED ORDER — MIDAZOLAM HCL 5 MG/5ML IJ SOLN
INTRAMUSCULAR | Status: DC | PRN
  Administered 2013-03-19: 2 mg via INTRAVENOUS

## 2013-03-19 MED ORDER — ONDANSETRON HCL 4 MG/2ML IJ SOLN
INTRAMUSCULAR | Status: DC | PRN
  Administered 2013-03-19: 4 mg via INTRAVENOUS

## 2013-03-19 MED ORDER — LIDOCAINE HCL (CARDIAC) 20 MG/ML IV SOLN
INTRAVENOUS | Status: DC | PRN
  Administered 2013-03-19: 100 mg via INTRAVENOUS

## 2013-03-19 MED ORDER — FENTANYL CITRATE 0.05 MG/ML IJ SOLN: 50.0000 ug | INTRAMUSCULAR | Status: DC | PRN

## 2013-03-19 MED ORDER — PROPOFOL 10 MG/ML IV BOLUS
INTRAVENOUS | Status: DC | PRN
  Administered 2013-03-19: 150 mg via INTRAVENOUS

## 2013-03-19 MED ORDER — CEFAZOLIN SODIUM-DEXTROSE 2-3 GM-% IV SOLR
2.0000 g | INTRAVENOUS | Status: AC
  Administered 2013-03-19: 2 g via INTRAVENOUS

## 2013-03-19 MED ORDER — HYDROCODONE-ACETAMINOPHEN 5-325 MG PO TABS: 1.0000 | ORAL_TABLET | ORAL | Status: DC | PRN

## 2013-03-19 MED ORDER — OXYCODONE HCL 5 MG/5ML PO SOLN: 5.0000 mg | Freq: Once | ORAL | Status: DC | PRN

## 2013-03-19 MED ORDER — HYDROMORPHONE HCL PF 1 MG/ML IJ SOLN
0.2500 mg | INTRAMUSCULAR | Status: DC | PRN
  Administered 2013-03-19 (×2): 0.5 mg via INTRAVENOUS

## 2013-03-19 MED ORDER — FENTANYL CITRATE 0.05 MG/ML IJ SOLN
INTRAMUSCULAR | Status: DC | PRN
  Administered 2013-03-19: 100 ug via INTRAVENOUS

## 2013-03-19 MED ORDER — MIDAZOLAM HCL 2 MG/ML PO SYRP: 12.0000 mg | ORAL_SOLUTION | Freq: Once | ORAL | Status: DC | PRN

## 2013-03-19 MED ORDER — MIDAZOLAM HCL 2 MG/2ML IJ SOLN: 1.0000 mg | INTRAMUSCULAR | Status: DC | PRN

## 2013-03-19 MED ORDER — ONDANSETRON HCL 4 MG/2ML IJ SOLN: 4.0000 mg | Freq: Once | INTRAMUSCULAR | Status: DC | PRN

## 2013-03-19 MED ORDER — MEPERIDINE HCL 25 MG/ML IJ SOLN: 6.2500 mg | INTRAMUSCULAR | Status: DC | PRN

## 2013-03-19 MED ORDER — DEXAMETHASONE SODIUM PHOSPHATE 4 MG/ML IJ SOLN
INTRAMUSCULAR | Status: DC | PRN
  Administered 2013-03-19: 10 mg via INTRAVENOUS

## 2013-03-19 MED ORDER — OXYCODONE HCL 5 MG PO TABS: 5.0000 mg | ORAL_TABLET | Freq: Once | ORAL | Status: DC | PRN

## 2013-03-19 SURGICAL SUPPLY — 41 items
BANDAGE ELASTIC 4 VELCRO ST LF (GAUZE/BANDAGES/DRESSINGS) IMPLANT
BLADE HEX COATED 2.75 (ELECTRODE) ×2 IMPLANT
BLADE SURG 15 STRL LF DISP TIS (BLADE) ×1 IMPLANT
BLADE SURG 15 STRL SS (BLADE) ×1
CANISTER SUCTION 1200CC (MISCELLANEOUS) ×2 IMPLANT
CHLORAPREP W/TINT 26ML (MISCELLANEOUS) ×2 IMPLANT
COVER MAYO STAND STRL (DRAPES) ×2 IMPLANT
COVER TABLE BACK 60X90 (DRAPES) ×2 IMPLANT
DECANTER SPIKE VIAL GLASS SM (MISCELLANEOUS) IMPLANT
DERMABOND ADVANCED (GAUZE/BANDAGES/DRESSINGS) ×1
DERMABOND ADVANCED .7 DNX12 (GAUZE/BANDAGES/DRESSINGS) ×1 IMPLANT
DRAPE EXTREMITY T 121X128X90 (DRAPE) IMPLANT
DRAPE PED LAPAROTOMY (DRAPES) ×2 IMPLANT
DRAPE UTILITY XL STRL (DRAPES) ×2 IMPLANT
DRSG TEGADERM 4X4.75 (GAUZE/BANDAGES/DRESSINGS) IMPLANT
ELECT REM PT RETURN 9FT ADLT (ELECTROSURGICAL) ×2
ELECTRODE REM PT RTRN 9FT ADLT (ELECTROSURGICAL) ×1 IMPLANT
GAUZE SPONGE 4X4 12PLY STRL LF (GAUZE/BANDAGES/DRESSINGS) IMPLANT
GAUZE SPONGE 4X4 16PLY XRAY LF (GAUZE/BANDAGES/DRESSINGS) IMPLANT
GLOVE BIOGEL PI IND STRL 7.0 (GLOVE) ×1 IMPLANT
GLOVE BIOGEL PI INDICATOR 7.0 (GLOVE) ×1
GLOVE ECLIPSE 6.5 STRL STRAW (GLOVE) ×2 IMPLANT
GLOVE EUDERMIC 7 POWDERFREE (GLOVE) ×2 IMPLANT
GLOVE EXAM NITRILE MD LF STRL (GLOVE) ×2 IMPLANT
GOWN PREVENTION PLUS XLARGE (GOWN DISPOSABLE) ×4 IMPLANT
NEEDLE HYPO 25X1 1.5 SAFETY (NEEDLE) ×2 IMPLANT
NS IRRIG 1000ML POUR BTL (IV SOLUTION) ×2 IMPLANT
PACK BASIN DAY SURGERY FS (CUSTOM PROCEDURE TRAY) ×2 IMPLANT
PEN SKIN MARKING BROAD TIP (MISCELLANEOUS) IMPLANT
PENCIL BUTTON HOLSTER BLD 10FT (ELECTRODE) ×2 IMPLANT
SLEEVE SCD COMPRESS KNEE MED (MISCELLANEOUS) ×2 IMPLANT
SPONGE LAP 4X18 X RAY DECT (DISPOSABLE) ×2 IMPLANT
STAPLER VISISTAT 35W (STAPLE) IMPLANT
SUT MNCRL AB 4-0 PS2 18 (SUTURE) ×2 IMPLANT
SUT VICRYL 3-0 CR8 SH (SUTURE) ×2 IMPLANT
SYR BULB 3OZ (MISCELLANEOUS) IMPLANT
SYR CONTROL 10ML LL (SYRINGE) ×2 IMPLANT
TOWEL OR 17X24 6PK STRL BLUE (TOWEL DISPOSABLE) ×2 IMPLANT
TOWEL OR NON WOVEN STRL DISP B (DISPOSABLE) ×2 IMPLANT
TUBE CONNECTING 20X1/4 (TUBING) ×2 IMPLANT
YANKAUER SUCT BULB TIP NO VENT (SUCTIONS) ×2 IMPLANT

## 2013-03-19 NOTE — Anesthesia Procedure Notes (Signed)
Procedure Name: LMA Insertion Date/Time: 03/19/2013 8:42 AM Performed by: Caren Macadam Pre-anesthesia Checklist: Patient identified, Emergency Drugs available, Suction available and Patient being monitored Patient Re-evaluated:Patient Re-evaluated prior to inductionOxygen Delivery Method: Circle System Utilized Preoxygenation: Pre-oxygenation with 100% oxygen Intubation Type: IV induction Ventilation: Mask ventilation without difficulty LMA: LMA inserted LMA Size: 4.0 Number of attempts: 1 Airway Equipment and Method: bite block Placement Confirmation: positive ETCO2 Tube secured with: Tape Dental Injury: Teeth and Oropharynx as per pre-operative assessment

## 2013-03-19 NOTE — Transfer of Care (Signed)
Immediate Anesthesia Transfer of Care Note  Patient: Jacqueline Walter  Procedure(s) Performed: Procedure(s): excision of extraneous breast/fat tissue left axilla (Left)  Patient Location: PACU  Anesthesia Type:General  Level of Consciousness: sedated  Airway & Oxygen Therapy: Patient Spontanous Breathing and Patient connected to face mask oxygen  Post-op Assessment: Report given to PACU RN and Post -op Vital signs reviewed and stable  Post vital signs: Reviewed and stable  Complications: No apparent anesthesia complications

## 2013-03-19 NOTE — Op Note (Signed)
Jacqueline Walter 1980-09-01 [id number] 02/14/2013  Preoperative diagnosis: accessory breast tissue, left axilla  Postoperative diagnosis: same  Procedure: excision of accessory breast tissue left axilla (5 cm x 3 cm)  Surgeon: Currie Paris, MD, FACS   Anesthesia: GA combined with regional for post-op pain   Clinical History and Indications: this patient has presented with a soft axillary mass that was consistent with accessory breast tissue. She had drainage when she had been pregnant and wished to have this excised prior to another pregnancy.    Description of Procedure: I saw the patient in the preoperative area, confirmed the plans with her, and we both mark the left axillary area as the surgical site.  The patient was taken to the operating room, and after satisfactory general anesthesia was obtained, the left axillary area was prepped and draped and a timeout performed.  I made an elliptical incision approximately 5 cm long and approximately 3 cm wide. Using cautery I excised the palpable and visibly abnormal subcutaneous tissue which appeared to be fat with breast tissue stranded through it. I got down to the axillary area but did not enter the axilla itself. Once this area was removed I irrigated and made sure everything was dry. I infiltrated 20 cc of 0.25% plain Marcaine to help with postop anesthesia.  The incision was closed in layers with 3-0 Vicryl, 4-0 Monocryl subcuticular, plus Dermabond on the skin. The patient tired the procedure well. There were no operative complications. Counts were correct. Blood loss was minimal.  Currie Paris, MD, FACS 03/19/2013 9:14 AM

## 2013-03-19 NOTE — Anesthesia Postprocedure Evaluation (Signed)
Anesthesia Post Note  Patient: Jacqueline Walter  Procedure(s) Performed: Procedure(s) (LRB): excision of extraneous breast/fat tissue left axilla (Left)  Anesthesia type: general  Patient location: PACU  Post pain: Pain level controlled  Post assessment: Patient's Cardiovascular Status Stable  Last Vitals:  Filed Vitals:   03/19/13 1057  BP: 105/62  Pulse: 74  Temp: 36.7 C  Resp: 16    Post vital signs: Reviewed and stable  Level of consciousness: sedated  Complications: No apparent anesthesia complications

## 2013-03-19 NOTE — H&P (Signed)
  IMPRESSION:  Accessory breast tissue, left axilla, symptomatic when pregnant with production of milk  PLAN:  I discussed surgical excision with her and she has on additional questions. She and I have both marked the left axillary area as the surgical site CC:  Chief Complaint   Patient presents with   .  Breast Mass     left   HPI: Jacqueline Walter is a 32 y.o. female who presents for surgery for a left axillarymass. This is been present for quite a while. When she was pregnant it got larger actually produced milk. It has gotten smaller after she sees producing milk. Her last baby 31 years old. She does intend to get pregnant again. She is interested in having this removed wishes to have additional problems with her next pregnancy.  PMH:  Past Medical History  Diagnosis Date  . Breast mass, left    .  PSH:  Past Surgical History  Procedure Laterality Date  . No past surgeries      ALLERGIES: No Known Allergies  MEDICATIONS: No current outpatient prescriptions on file.  ROS: She  has filled out our 12 point review of systems and it is negative .  EXAM:  VITAL SIGNS:  BP 102/60  Pulse 72  Resp 14  Ht 5' (1.524 m)  Wt 123 lb (55.792 kg)  BMI 24.02 kg/m2  GENERAL:  The patient is alert, oriented, and generally healthy-appearing, NAD. Mood and affect are normal.  HEENT:  The head is normocephalic, the eyes nonicteric, the pupils were round regular and equal. EOMs are normal. Pharynx normal. Dentition good.  NECK:  The neck is supple and there are no masses or thyromegaly.  LUNGS:  Normal respirations and clear to auscultation.  HEART:  Regular rhythm, with no murmurs rubs or gallops. Pulses are intact carotid dorsalis pedis and posterior tibial. No significant varicosities are noted.  BREASTS:  the breasts are symmetric. No breast mass per se. There is no skin, nipple, or other abnormalities noted.  AXILLA: There is no mass or other abnormality in the right axilla. There is a  soft mass in the left axilla consistent with axillary accessory breast tissue. It is nontender. It is somewhat mobile, not fixed. I did not appreciate a nipple on the skin.  ABDOMEN:  Soft, flat, and nontender. No masses or organomegaly is noted. No hernias are noted. Bowel sounds are normal.  EXTREMITIES:  Good range of motion, no edema.

## 2013-03-19 NOTE — Anesthesia Preprocedure Evaluation (Signed)

## 2013-03-20 ENCOUNTER — Encounter (HOSPITAL_BASED_OUTPATIENT_CLINIC_OR_DEPARTMENT_OTHER): Payer: Self-pay | Admitting: Surgery

## 2013-04-04 ENCOUNTER — Ambulatory Visit (INDEPENDENT_AMBULATORY_CARE_PROVIDER_SITE_OTHER): Payer: PRIVATE HEALTH INSURANCE | Admitting: Surgery

## 2013-04-04 ENCOUNTER — Encounter (INDEPENDENT_AMBULATORY_CARE_PROVIDER_SITE_OTHER): Payer: Self-pay | Admitting: Surgery

## 2013-04-04 ENCOUNTER — Encounter (INDEPENDENT_AMBULATORY_CARE_PROVIDER_SITE_OTHER): Payer: Self-pay

## 2013-04-04 VITALS — BP 110/70 | HR 84 | Resp 16 | Ht 61.0 in | Wt 123.0 lb

## 2013-04-04 DIAGNOSIS — Z09 Encounter for follow-up examination after completed treatment for conditions other than malignant neoplasm: Secondary | ICD-10-CM

## 2013-04-04 NOTE — Progress Notes (Signed)
NAME: Jacqueline Walter                                            DOB: [birth year] DATE: 04/04/2013                                                  MRN: [id number]  CC:  Chief Complaint  Patient presents with  . Routine Post Op    HPI: This patient comes in for post op follow-up .Sheunderwent excision bilaterally of accessory axillary breast tissue on 03/19/2013. She feels that she is doing well.  PE:  VITAL SIGNS: There were no vitals taken for this visit.  General: The patient appears to be healthy, NAD Incision healing nicely, no wound complications  DATA REVIEWED: Pathology: Diagnosis Breast, accessory tissue, left, axilla - BENIGN BREAST TISSUE. - NEGATIVE FOR ATYPIA OR MALIGNANCY. [place] RUND DO Pathologist, Electronic Signature  IMPRESSION: The patient is doing well S/P removal of left axillary accesory breast.    PLAN: RTC PRN I gave the patient a copy of the pathology report and reviewed it with her

## 2013-04-22 ENCOUNTER — Ambulatory Visit (INDEPENDENT_AMBULATORY_CARE_PROVIDER_SITE_OTHER): Payer: PRIVATE HEALTH INSURANCE | Admitting: Surgery

## 2013-04-22 ENCOUNTER — Telehealth: Payer: Self-pay | Admitting: Family Medicine

## 2013-04-22 ENCOUNTER — Encounter (INDEPENDENT_AMBULATORY_CARE_PROVIDER_SITE_OTHER): Payer: Self-pay | Admitting: Surgery

## 2013-04-22 VITALS — BP 116/61 | HR 66 | Temp 96.7°F | Resp 12 | Ht 62.0 in | Wt 123.8 lb

## 2013-04-22 DIAGNOSIS — Z09 Encounter for follow-up examination after completed treatment for conditions other than malignant neoplasm: Secondary | ICD-10-CM

## 2013-04-22 NOTE — Telephone Encounter (Signed)
Error

## 2013-04-22 NOTE — Patient Instructions (Signed)
Start doing the massage of the cord in the left armpit and arm area. If it is not improved in two weeks give [place] a call and we will arrange some physical therapy

## 2013-04-22 NOTE — Progress Notes (Signed)
NAME: Jacqueline Walter       DOB: August 25, 1980           DATE: 04/22/2013       ZOX:[id number]  CC:  Chief Complaint  Patient presents with  . Follow-up    HPI: She underwent bilateral excision of accessory breast tissue in the axilla. She was doing well on initial post op visit but now has some painm the the left side with some discomfort down the left medial arm and some associated numbness  EXAM: Vital signs: BP 116/61  Pulse 66  Temp(Src) 96.7 F (35.9 C) (Temporal)  Resp 12  Ht 5\' 2"  (1.575 m)  Wt 123 lb 12.8 oz (56.155 kg)  BMI 22.64 kg/m2  General: Patient alert, oriented, NAD  Right axilla has a well healed incision. On the left the incision is healed but there is some slight cording developing and extending as usual into the medial upper arm. That is a little tender IMP: Mild cording post excision of axillary breast tissue  PLAN: Reviewed some massage and stretches to do. If that  Does not improve her cording we will make a PT referral  Ritta Hammes J 04/22/2013

## 2014-03-21 ENCOUNTER — Inpatient Hospital Stay (HOSPITAL_COMMUNITY): Payer: Medicaid Other

## 2014-03-21 ENCOUNTER — Encounter (HOSPITAL_COMMUNITY): Payer: Self-pay | Admitting: *Deleted

## 2014-03-21 ENCOUNTER — Inpatient Hospital Stay (HOSPITAL_COMMUNITY)
Admission: AD | Admit: 2014-03-21 | Discharge: 2014-03-21 | Disposition: A | Discharge status: Home / Self Care | Payer: Medicaid Other | Source: Ambulatory Visit | Attending: Obstetrics & Gynecology | Admitting: Obstetrics & Gynecology

## 2014-03-21 DIAGNOSIS — O200 Threatened abortion: Secondary | ICD-10-CM

## 2014-03-21 LAB — WET PREP, GENITAL
CLUE CELLS WET PREP: NONE SEEN
TRICH WET PREP: NONE SEEN
Yeast Wet Prep HPF POC: NONE SEEN

## 2014-03-21 LAB — URINE MICROSCOPIC-ADD ON

## 2014-03-21 LAB — CBC
HEMATOCRIT: 35.3 % — AB (ref 36.0–46.0)
Hemoglobin: 12.3 g/dL (ref 12.0–15.0)
MCH: 27.9 pg (ref 26.0–34.0)
MCHC: 34.8 g/dL (ref 30.0–36.0)
MCV: 80 fL (ref 78.0–100.0)
PLATELETS: 239 10*3/uL (ref 150–400)
RBC: 4.41 MIL/uL (ref 3.87–5.11)
RDW: 13.6 % (ref 11.5–15.5)
WBC: 9 10*3/uL (ref 4.0–10.5)

## 2014-03-21 LAB — URINALYSIS, ROUTINE W REFLEX MICROSCOPIC
Bilirubin Urine: NEGATIVE
GLUCOSE, UA: NEGATIVE mg/dL
KETONES UR: NEGATIVE mg/dL
Nitrite: NEGATIVE
PROTEIN: NEGATIVE mg/dL
Specific Gravity, Urine: 1.015 (ref 1.005–1.030)
UROBILINOGEN UA: 0.2 mg/dL (ref 0.0–1.0)
pH: 7 (ref 5.0–8.0)

## 2014-03-21 LAB — POCT PREGNANCY, URINE: Preg Test, Ur: POSITIVE — AB

## 2014-03-21 LAB — HCG, QUANTITATIVE, PREGNANCY: hCG, Beta Chain, Quant, S: 8838 m[IU]/mL — ABNORMAL HIGH (ref <5)

## 2014-03-21 NOTE — Discharge Instructions (Signed)
Amenaza de aborto °(Threatened Miscarriage) °La amenaza de aborto se produce cuando hay hemorragia vaginal durante las primeras 20 semanas de embarazo, pero el embarazo no se interrumpe. El médico le hará pruebas para asegurarse de que el embarazo continúe. La causa de la hemorragia puede ser desconocida. Este trastorno no significa que el embarazo terminará. Sin embargo, aumenta el riesgo de que el embarazo se interrumpa (aborto completo). °CUIDADOS EN EL HOGAR  °· Asegúrese de asistir a todas las citas de cuidados prenatales con el médico. °· Descanse lo suficiente. °· No tenga relaciones sexuales ni use tampones si tiene hemorragia vaginal. °· No se haga duchas vaginales. °· No fume ni consuma drogas. °· No beba alcohol. °· Evite la cafeína. °SOLICITE AYUDA SI: °· Tiene una hemorragia leve de la vagina. °· Tiene dolor o cólicos abdominales. °· Tiene fiebre. °SOLICITE AYUDA DE INMEDIATO SI:  °· Tiene hemorragia abundante de la vagina. °· Elimina coágulos de sangre por la vagina. °· Tiene mucho dolor en el abdomen o la parte baja de la espalda, cólicos abdominales o calambres en la parte baja de la espalda. °· Tiene fiebre, escalofríos y mucho dolor abdominal. °ASEGÚRESE DE QUE:  °· Comprende estas instrucciones. °· Controlará su afección. °· Recibirá ayuda de inmediato si no mejora o si empeora. °Document Released: 08/19/2010 Document Revised: 07/22/2013 °[employer]® Patient Information ©2015 [employer]. This information is not intended to replace advice given to you by your health care provider. Make sure you discuss any questions you have with your health care provider. ° °

## 2014-03-21 NOTE — MAU Provider Note (Signed)
History     CSN: [id number]  Arrival date and time: 03/21/14 1605   None     Chief Complaint  Patient presents with  . Vaginal Bleeding   HPI 33 y.o. G2P1001 at [redacted]w[redacted]d w/ light vaginal bleeding starting today, mild crampy pain. Intercourse last night. Pt states she had a positve UPT at [facility] on 02/05/14. Patient's last menstrual period was 11/19/2013.   Past Medical History  Diagnosis Date  . Breast mass, left     Past Surgical History  Procedure Laterality Date  . Mass excision Left 03/19/2013    Procedure: excision of extraneous breast/fat tissue left axilla;  Surgeon: Haywood Lasso, MD;  Location: [place];  Service: General;  Laterality: Left;    Family History  Problem Relation Age of Onset  . Colon cancer Maternal Grandmother   . Cancer Maternal Grandmother     unknown    History  Substance Use Topics  . Smoking status: Never Smoker   . Smokeless tobacco: Never Used  . Alcohol Use: No    Allergies: No Known Allergies  Prescriptions prior to admission  Medication Sig Dispense Refill  . Prenatal Vit-Fe Fumarate-FA (PRENATAL MULTIVITAMIN) TABS tablet Take 1 tablet by mouth every morning.        Review of Systems  Constitutional: Negative.   Respiratory: Negative.   Cardiovascular: Negative.   Gastrointestinal: Negative for nausea, vomiting, abdominal pain, diarrhea and constipation.  Genitourinary: Negative for dysuria, urgency, frequency, hematuria and flank pain.       Positive for vaginal bleeding   Musculoskeletal: Negative.   Neurological: Negative.   Psychiatric/Behavioral: Negative.    Physical Exam   Blood pressure 104/63, pulse 82, temperature 98.6 F (37 C), temperature source Oral, resp. rate 16, height 5' (1.524 m), weight 125 lb (56.7 kg), last menstrual period 11/19/2013.  Physical Exam  Nursing note and vitals reviewed. Constitutional: She is oriented to person, place, and time. She appears well-developed and  well-nourished. No distress.  Cardiovascular: Normal rate.   Respiratory: Effort normal.  GI: Soft. There is no tenderness.  Genitourinary: There is no rash, tenderness or lesion on the right labia. There is no rash, tenderness or lesion on the left labia. Uterus is not tender. Cervix exhibits no motion tenderness, no discharge and no friability. Right adnexum displays no mass, no tenderness and no fullness. Left adnexum displays no mass, no tenderness and no fullness. There is bleeding (small) around the vagina.  Unable to doppler FHT  Musculoskeletal: Normal range of motion.  Neurological: She is alert and oriented to person, place, and time.  Skin: Skin is warm and dry.  Psychiatric: She has a normal mood and affect.    MAU Course  Procedures  Results for orders placed during the hospital encounter of 03/21/14 (from the past 24 hour(s))  URINALYSIS, ROUTINE W REFLEX MICROSCOPIC     Status: Abnormal   Collection Time    03/21/14  4:15 PM      Result Value Ref Range   Color, Urine YELLOW  YELLOW   APPearance HAZY (*) CLEAR   Specific Gravity, Urine 1.015  1.005 - 1.030   pH 7.0  5.0 - 8.0   Glucose, UA NEGATIVE  NEGATIVE mg/dL   Hgb urine dipstick LARGE (*) NEGATIVE   Bilirubin Urine NEGATIVE  NEGATIVE   Ketones, ur NEGATIVE  NEGATIVE mg/dL   Protein, ur NEGATIVE  NEGATIVE mg/dL   Urobilinogen, UA 0.2  0.0 - 1.0 mg/dL   Nitrite  NEGATIVE  NEGATIVE   Leukocytes, UA TRACE (*) NEGATIVE  URINE MICROSCOPIC-ADD ON     Status: Abnormal   Collection Time    03/21/14  4:15 PM      Result Value Ref Range   Squamous Epithelial / LPF FEW (*) RARE   WBC, UA 3-6  <3 WBC/hpf   RBC / HPF 21-50  <3 RBC/hpf   Bacteria, UA FEW (*) RARE  WET PREP, GENITAL     Status: Abnormal   Collection Time    03/21/14  4:55 PM      Result Value Ref Range   Yeast Wet Prep HPF POC NONE SEEN  NONE SEEN   Trich, Wet Prep NONE SEEN  NONE SEEN   Clue Cells Wet Prep HPF POC NONE SEEN  NONE SEEN   WBC, Wet  Prep HPF POC FEW (*) NONE SEEN  POCT PREGNANCY, URINE     Status: Abnormal   Collection Time    03/21/14  5:00 PM      Result Value Ref Range   Preg Test, Ur POSITIVE (*) NEGATIVE  CBC     Status: Abnormal   Collection Time    03/21/14  5:19 PM      Result Value Ref Range   WBC 9.0  4.0 - 10.5 K/uL   RBC 4.41  3.87 - 5.11 MIL/uL   Hemoglobin 12.3  12.0 - 15.0 g/dL   HCT 35.3 (*) 36.0 - 46.0 %   MCV 80.0  78.0 - 100.0 fL   MCH 27.9  26.0 - 34.0 pg   MCHC 34.8  30.0 - 36.0 g/dL   RDW 13.6  11.5 - 15.5 %   Platelets 239  150 - 400 K/uL  HCG, QUANTITATIVE, PREGNANCY     Status: Abnormal   Collection Time    03/21/14  5:19 PM      Result Value Ref Range   hCG, Beta Chain, Quant, S 8838 (*) <5 mIU/mL   US Ob Comp Less 14 Wks  03/21/2014   CLINICAL DATA:  Unsure of dates  EXAM: OBSTETRIC <14 WK [place] AND TRANSVAGINAL OB US  TECHNIQUE: Both transabdominal and transvaginal ultrasound examinations were performed for complete evaluation of the gestation as well as the maternal uterus, adnexal regions, and pelvic cul-de-sac. Transvaginal technique was performed to assess early pregnancy.  COMPARISON:  None.  FINDINGS: Intrauterine gestational sac: Visualized the contour of the sac is unremarkable. There is a small amount of debris in this sac.  Yolk sac:  Not visualized  Embryo:  Visualized  Cardiac Activity: Not visualized  CRL:   3 mm  mm 5 w 6 d  Maternal uterus/adnexae: There is no subchorionic hemorrhage. No intrauterine mass. Cervical os is closed. Both maternal ovaries appear normal. There is no extrauterine pelvic or adnexal mass. No free pelvic fluid.  IMPRESSION: No cardiac activity identified. Mild debris in gestational sac. Yolk sac not seen. Findings are suspicious but not yet definitive for failed pregnancy. Recommend follow-up US in 10-14 days for definitive diagnosis. This recommendation follows SRU consensus guidelines: Diagnostic Criteria for Nonviable Pregnancy Early in the First  Trimester. [employer] Med 2013; 347:4259-56.   Electronically Signed   By: Lowella Grip M.D.   On: 03/21/2014 18:34   US Ob Transvaginal  03/21/2014   CLINICAL DATA:  Unsure of dates  EXAM: OBSTETRIC <14 WK [place] AND TRANSVAGINAL OB US  TECHNIQUE: Both transabdominal and transvaginal ultrasound examinations were performed for complete evaluation of the  gestation as well as the maternal uterus, adnexal regions, and pelvic cul-de-sac. Transvaginal technique was performed to assess early pregnancy.  COMPARISON:  None.  FINDINGS: Intrauterine gestational sac: Visualized the contour of the sac is unremarkable. There is a small amount of debris in this sac.  Yolk sac:  Not visualized  Embryo:  Visualized  Cardiac Activity: Not visualized  CRL:   3 mm  mm 5 w 6 d  Maternal uterus/adnexae: There is no subchorionic hemorrhage. No intrauterine mass. Cervical os is closed. Both maternal ovaries appear normal. There is no extrauterine pelvic or adnexal mass. No free pelvic fluid.  IMPRESSION: No cardiac activity identified. Mild debris in gestational sac. Yolk sac not seen. Findings are suspicious but not yet definitive for failed pregnancy. Recommend follow-up US in 10-14 days for definitive diagnosis. This recommendation follows SRU consensus guidelines: Diagnostic Criteria for Nonviable Pregnancy Early in the First Trimester. [employer] Med 2013; 885:0277-41.   Electronically Signed   By: Lowella Grip M.D.   On: 03/21/2014 18:34    Assessment and Plan   1. Threatened abortion in first trimester   Discussed w/ patient that this is highly suspicious for SAB, especially considering reported timeline from + UPT at health dept, but based on today's information we cannot definitively diagnosed SAB. Pt to return in 10-14 days for repeat u/s. Precautions rev'd.     Medication List         prenatal multivitamin Tabs tablet  Take 1 tablet by mouth every morning.            Follow-up Information   Follow up  with [facility] On 03/31/2014. (someone will call to schedule appointment)    Specialty:  Radiology   Contact information:   [address] [id number] [place] [address] [phone]        [facility] 03/21/2014, 6:47 PM

## 2014-03-21 NOTE — MAU Note (Signed)
Pt/ pt's mother states saw bright red blood when wiping x1 and came to MAU.

## 2014-03-24 LAB — GC/CHLAMYDIA PROBE AMP
CT PROBE, AMP APTIMA: NEGATIVE
GC Probe RNA: NEGATIVE

## 2014-03-29 ENCOUNTER — Inpatient Hospital Stay (HOSPITAL_COMMUNITY)
Admission: AD | Admit: 2014-03-29 | Discharge: 2014-03-29 | Disposition: A | Discharge status: Home / Self Care | Payer: Self-pay | Source: Ambulatory Visit | Attending: Obstetrics & Gynecology | Admitting: Obstetrics & Gynecology

## 2014-03-29 ENCOUNTER — Encounter (HOSPITAL_COMMUNITY): Payer: Self-pay

## 2014-03-29 ENCOUNTER — Inpatient Hospital Stay (HOSPITAL_COMMUNITY): Payer: Medicaid Other

## 2014-03-29 DIAGNOSIS — O039 Complete or unspecified spontaneous abortion without complication: Secondary | ICD-10-CM | POA: Insufficient documentation

## 2014-03-29 LAB — URINALYSIS, ROUTINE W REFLEX MICROSCOPIC
BILIRUBIN URINE: NEGATIVE
GLUCOSE, UA: NEGATIVE mg/dL
KETONES UR: 15 mg/dL — AB
Nitrite: POSITIVE — AB
PROTEIN: 100 mg/dL — AB
Specific Gravity, Urine: 1.025 (ref 1.005–1.030)
UROBILINOGEN UA: 1 mg/dL (ref 0.0–1.0)
pH: 5 (ref 5.0–8.0)

## 2014-03-29 LAB — URINE MICROSCOPIC-ADD ON

## 2014-03-29 LAB — HCG, QUANTITATIVE, PREGNANCY: hCG, Beta Chain, Quant, S: 2473 m[IU]/mL — ABNORMAL HIGH (ref <5)

## 2014-03-29 MED ORDER — LACTATED RINGERS IV SOLN
Freq: Once | INTRAVENOUS | Status: AC
  Administered 2014-03-29: 14:00:00 via INTRAVENOUS
  Filled 2014-03-29: qty 1000

## 2014-03-29 MED ORDER — IBUPROFEN 800 MG PO TABS: 800.0000 mg | ORAL_TABLET | Freq: Three times a day (TID) | ORAL | Status: DC

## 2014-03-29 MED ORDER — MISOPROSTOL 200 MCG PO TABS: 200.0000 ug | ORAL_TABLET | Freq: Four times a day (QID) | ORAL | Status: DC

## 2014-03-29 MED ORDER — PROMETHAZINE HCL 25 MG PO TABS: 25.0000 mg | ORAL_TABLET | Freq: Four times a day (QID) | ORAL | Status: DC | PRN

## 2014-03-29 MED ORDER — OXYCODONE-ACETAMINOPHEN 5-325 MG PO TABS: 2.0000 | ORAL_TABLET | ORAL | Status: DC | PRN

## 2014-03-29 NOTE — MAU Provider Note (Signed)
History     CSN: [id number]  Arrival date and time: 03/29/14 1142   First Provider Initiated Contact with Patient 03/29/14 1303      Chief Complaint  Patient presents with  . Vaginal Bleeding   HPI Comments: Jacqueline Walter 33 y.o. G2P1001 [redacted]w[redacted]d presents to MAU with pain with SAB. She developed worsening pain and cramping with nausea last night. She has not been unable to eat. Pt declines interpreter and prefers to use her family member.  Vaginal Bleeding Associated symptoms include abdominal pain, nausea and vomiting.      Past Medical History  Diagnosis Date  . Breast mass, left     Past Surgical History  Procedure Laterality Date  . Mass excision Left 03/19/2013    Procedure: excision of extraneous breast/fat tissue left axilla;  Surgeon: Haywood Lasso, MD;  Location: [place];  Service: General;  Laterality: Left;    Family History  Problem Relation Age of Onset  . Colon cancer Maternal Grandmother   . Cancer Maternal Grandmother     unknown    History  Substance Use Topics  . Smoking status: Never Smoker   . Smokeless tobacco: Never Used  . Alcohol Use: No    Allergies: No Known Allergies  Prescriptions prior to admission  Medication Sig Dispense Refill  . Prenatal Vit-Fe Fumarate-FA (PRENATAL MULTIVITAMIN) TABS tablet Take 1 tablet by mouth every morning.        Review of Systems  Constitutional: Negative.   HENT: Negative.   Eyes: Negative.   Respiratory: Negative.   Cardiovascular: Negative.   Gastrointestinal: Positive for nausea, vomiting and abdominal pain.  Genitourinary: Negative.   Musculoskeletal: Negative.   Skin: Negative.   Neurological: Negative.   Psychiatric/Behavioral: Negative.    Physical Exam   Blood pressure 107/57, pulse 89, temperature 98.8 F (37.1 C), temperature source Oral, resp. rate 18, height 5' (1.524 m), weight 125 lb (56.7 kg), last menstrual period 11/19/2013.  Physical Exam   Constitutional: She is oriented to person, place, and time. She appears well-developed and well-nourished. She appears distressed.  Mildly  distressed  HENT:  Head: Normocephalic and atraumatic.  Eyes: Pupils are equal, round, and reactive to light.  Cardiovascular: Normal rate, regular rhythm and normal heart sounds.   Respiratory: Effort normal and breath sounds normal.  GI: Soft. There is tenderness.  Genitourinary:  Genital:external negative Vaginal: moderate amount bright red blood with clots Cervix:closed/ thick Bimanual: tender   Musculoskeletal: Normal range of motion.  Neurological: She is alert and oriented to person, place, and time.  Skin: Skin is warm and dry.  Psychiatric: She has a normal mood and affect. Her behavior is normal. Judgment and thought content normal.   Results for orders placed during the hospital encounter of 03/29/14 (from the past 24 hour(s))  URINALYSIS, ROUTINE W REFLEX MICROSCOPIC     Status: Abnormal   Collection Time    03/29/14 12:05 PM      Result Value Ref Range   Color, Urine RED (*) YELLOW   APPearance CLOUDY (*) CLEAR   Specific Gravity, Urine 1.025  1.005 - 1.030   pH 5.0  5.0 - 8.0   Glucose, UA NEGATIVE  NEGATIVE mg/dL   Hgb urine dipstick LARGE (*) NEGATIVE   Bilirubin Urine NEGATIVE  NEGATIVE   Ketones, ur 15 (*) NEGATIVE mg/dL   Protein, ur 100 (*) NEGATIVE mg/dL   Urobilinogen, UA 1.0  0.0 - 1.0 mg/dL   Nitrite POSITIVE (*) NEGATIVE  Leukocytes, UA SMALL (*) NEGATIVE  URINE MICROSCOPIC-ADD ON     Status: Abnormal   Collection Time    03/29/14 12:05 PM      Result Value Ref Range   Squamous Epithelial / LPF FEW (*) RARE   WBC, UA 0-2  <3 WBC/hpf   RBC / HPF TOO NUMEROUS TO COUNT  <3 RBC/hpf   Bacteria, UA FEW (*) RARE  HCG, QUANTITATIVE, PREGNANCY     Status: Abnormal   Collection Time    03/29/14  5:50 PM      Result Value Ref Range   hCG, Beta Chain, Quant, S 2473 (*) <5 mIU/mL  . US Ob Transvaginal  03/29/2014    CLINICAL DATA:  33 year old female with vaginal bleeding and increasing pain. Patient had recent ultrasound suspicious for failed first trimester pregnancy.  EXAM: TRANSVAGINAL OB ULTRASOUND  TECHNIQUE: Transvaginal ultrasound was performed for complete evaluation of the gestation as well as the maternal uterus, adnexal regions, and pelvic cul-de-sac.  COMPARISON:  03/21/2014 ultrasound  FINDINGS: Intrauterine gestational sac: An irregular cystic structure with internal septations is identified within the uterus.  Yolk sac:  Not visualized  Embryo:  Not visualized  Cardiac Activity: Not visualized  MSD: 22.8  mm   7 w   2  d  Maternal uterus/adnexae: There is no evidence of subchorionic hemorrhage.  The ovaries bilaterally are unremarkable.  There is no evidence of free fluid or adnexal mass.  IMPRESSION: Irregular intrauterine cystic structure (22.8 mm MSD) without fetal pole or yolk sac. Findings almost certainly represent but not yet definitive for failed pregnancy. Recommend follow-up US in 10-14 days for definitive diagnosis. This recommendation follows SRU consensus guidelines: Diagnostic Criteria for Nonviable Pregnancy Early in the First Trimester. [employer] Med 2013; 032:1224-82.   Electronically Signed   By: Hassan Rowan M.D.   On: 03/29/2014 16:26   Lab Results  Component Value Date   HCGBETAQNT 2473* 03/29/2014   HCGBETAQNT 8838* 03/21/2014   Blood Type O positive CBC on 8/22 with Hgb 12.3  MAU Course  Procedures  MDM Urine culture LR 1 liter with phenergan 25 mg  Declines pain medications now Requests ultrasound to be done today since she is here instead of tomorrow Discussed case with Dr Roselie Awkward who advised BHCG and if falling Cytotec   Assessment and Plan   A: SAB  P: Cytotec 800 mcg per vagina Percocet/ phenergan/ motrin Advised to return to MAU if bleeding more than one pad/ hour Pelvic rest/ fluids Note for work Follow up [facility] Sept 6th  [name] 03/29/2014,  7:00 PM

## 2014-03-29 NOTE — MAU Note (Signed)
Pt presents to MAU with complaints of vaginal bleeding with abdominal cramping. She was evaluated last Saturday and was told she had a miscarriage. Reports she is suppose to follow up tomorrow in office but is having worse pain today

## 2014-03-29 NOTE — Discharge Instructions (Signed)
Aborto espontáneo  °(Miscarriage) ° El aborto espontáneo es la pérdida de un bebé que no ha nacido.(feto) antes de la semana 20 del embarazo. La causa generalmente es desconocida.  °CUIDADOS EN EL HOGAR  °· Debe permanecer en cama (reposo en cama) o podrá hacer actividades livianas. Regrese a sus actividades según las indicaciones del médico. °· Pida ayuda con las tareas domésticas. °· Anote cuántos apósitos usa por día. Describa el grado en que están empapados. °· No use tampones. No se higienice la vagina (duchas vaginales) ni tenga relaciones sexuales (coito) hasta que el médico la autorice. °· Sólo debe tomar la medicación según las indicaciones del médico. °· No tome aspirina. °· Cumpla con los controles médicos según las indicaciones. °· Si usted o su pareja tienen problemas con el duelo, hable con su médico. También puede intentar con psicoterapia. Permítase el tiempo suficiente de duelo antes de quedar embarazada nuevamente. °SOLICITE AYUDA DE INMEDIATO SI:  °· Siente cólicos intensos o dolor en el estómago, en la espalda o en el vientre (abdomen). °· Tiene fiebre. °· Elimina grumos de sangre (coágulos) por la vagina, que tienen el tamaño de una nuez o más. Guarde los coágulos para que el médico los vea. °· Elimina gran cantidad de tejidos por la vagina. Guarde lo que ha eliminado para que su médico lo examine. °· Aumenta el sangrado. °· Observa una secreción espesa, con mal olor (pérdida) que proviene de la vagina. °· Se siente mareada, débil o se desvanece (se desmaya). °· Siente escalofríos. °ASEGÚRESE DE QUE:  °· Comprende estas instrucciones. °· Controlará su enfermedad. °· Solicitará ayuda de inmediato si no mejora o si empeora. °Document Released: 01/16/2012 °ExitCare® Patient Information ©2015 [employer]. This information is not intended to replace advice given to you by your health care provider. Make sure you discuss any questions you have with your health care provider. ° °

## 2014-03-31 ENCOUNTER — Ambulatory Visit (HOSPITAL_COMMUNITY): Payer: Medicaid Other

## 2014-03-31 LAB — URINE CULTURE
COLONY COUNT: NO GROWTH
CULTURE: NO GROWTH
Special Requests: NORMAL

## 2014-04-02 ENCOUNTER — Ambulatory Visit: Payer: Medicaid Other

## 2014-04-07 ENCOUNTER — Ambulatory Visit (HOSPITAL_COMMUNITY): Admission: RE | Admit: 2014-04-07 | Payer: Medicaid Other | Source: Ambulatory Visit

## 2014-04-08 ENCOUNTER — Ambulatory Visit: Payer: Self-pay

## 2014-04-08 ENCOUNTER — Telehealth: Payer: Self-pay | Admitting: Obstetrics and Gynecology

## 2014-04-08 NOTE — Telephone Encounter (Signed)
Patient presents to MAU for follow up. Pt was seen in MAU 2 weeks ago for SAB and given cytotec. She was instructed to return for follow up (pt was unsure where to go). I called the clinic and was instructed to have the patient take an appointment in the clinic tomorrow (urgent follow up appointment) slot. Eda used for interpretation and patient voices understanding that she is to follow up tomorrow in the clinic at 1300.

## 2014-04-09 ENCOUNTER — Encounter: Payer: Self-pay | Admitting: Obstetrics and Gynecology

## 2014-04-09 ENCOUNTER — Ambulatory Visit (INDEPENDENT_AMBULATORY_CARE_PROVIDER_SITE_OTHER): Payer: Medicaid Other | Admitting: Obstetrics and Gynecology

## 2014-04-09 VITALS — BP 98/61 | HR 81 | Wt 124.1 lb

## 2014-04-09 DIAGNOSIS — O039 Complete or unspecified spontaneous abortion without complication: Secondary | ICD-10-CM

## 2014-04-09 NOTE — Progress Notes (Signed)
Patient ID: Jacqueline Walter, female   DOB: 07-Apr-1981, 33 y.o.   MRN: [id number] 32 yo G2P1011 diagnosed with a miscarriage on 03/29/2014 presenting today for follow up. Patient received medical management with cytotec on 8/30. Patient reports some light bleeding since.  Past Medical History  Diagnosis Date  . Breast mass, left    Past Surgical History  Procedure Laterality Date  . Mass excision Left 03/19/2013    Procedure: excision of extraneous breast/fat tissue left axilla;  Surgeon: Haywood Lasso, MD;  Location: [place];  Service: General;  Laterality: Left;   Family History  Problem Relation Age of Onset  . Colon cancer Maternal Grandmother   . Cancer Maternal Grandmother     unknown   History   Social History  . Marital Status: Single    Spouse Name: N/A    Number of Children: N/A  . Years of Education: N/A   Occupational History  . Not on file.   Social History Main Topics  . Smoking status: Never Smoker   . Smokeless tobacco: Never Used  . Alcohol Use: No  . Drug Use: No  . Sexual Activity: Yes    Birth Control/ Protection: None   Other Topics Concern  . Not on file   Social History Narrative  . No narrative on file   GENERAL: Well-developed, well-nourished female in no acute distress.  ABDOMEN: Soft, nontender, nondistended. No organomegaly. PELVIC: Normal external female genitalia. Vagina is pink and rugated.  Normal discharge. Normal appearing cervix. Uterus is normal in size. No adnexal mass or tenderness. EXTREMITIES: No cyanosis, clubbing, or edema, 2+ distal pulses.  A/P 33 yo G2P1011 with spontaneous abortion - Quant HCG today - This was a planned pregnancy. Patient advised to wait 3 months before trying to conceive again - Advised to start taking prenatal vitamins

## 2014-04-09 NOTE — Progress Notes (Signed)
Has some light bleeding.

## 2014-04-10 LAB — HCG, QUANTITATIVE, PREGNANCY: HCG, BETA CHAIN, QUANT, S: 784.4 m[IU]/mL

## 2014-04-14 ENCOUNTER — Telehealth: Payer: Self-pay

## 2014-04-14 NOTE — Telephone Encounter (Signed)
Called patient with [facility] interpreter ID# [phone] Allena Katz). Informed patient of the need to return for a repeat lab draw. Patient states she can come Friday 04/17/14 at 0930. Informed patient she would be put on the schedule. Message sent to admin pool to add patient to lab schedule.

## 2014-04-14 NOTE — Telephone Encounter (Signed)
Message copied by Geanie Logan on Tue Apr 14, 2014 11:06 AM ------      Message from: Mora Bellman      Created: Tue Apr 14, 2014  9:02 AM       Please instruct patient to return this week for repeat quant HCG (thursday or Friday)            Thanks            Peggy ------

## 2014-04-16 ENCOUNTER — Other Ambulatory Visit: Payer: Medicaid Other

## 2014-04-17 ENCOUNTER — Other Ambulatory Visit: Payer: Medicaid Other

## 2014-04-17 DIAGNOSIS — O039 Complete or unspecified spontaneous abortion without complication: Secondary | ICD-10-CM

## 2014-04-18 LAB — HCG, QUANTITATIVE, PREGNANCY: hCG, Beta Chain, Quant, S: 631.3 m[IU]/mL

## 2014-04-21 ENCOUNTER — Telehealth: Payer: Self-pay

## 2014-04-21 NOTE — Telephone Encounter (Signed)
[place] interpreter [phone] Elton Sin) used for this encounter. Attempted to call patient. NO answer. Left message stating we are calling to inform you of results, please call clinic at earliest convenience.

## 2014-04-21 NOTE — Telephone Encounter (Signed)
Message copied by Geanie Logan on Tue Apr 21, 2014  9:47 AM ------      Message from: Mora Bellman      Created: Sun Apr 19, 2014  9:44 AM       Patient needs to return for repeat quant hcg next week            peggy ------

## 2014-04-22 NOTE — Telephone Encounter (Signed)
[place] interpreter [phone] [facility]) used for this encounter. Informed patient of results and need for appointment next week for repeat lab draw. Patient verbalized understanding and states she can come in on Friday October 2 at 0830. NO questions or concerns. Message sent to admin pool to add to lab schedule.

## 2014-04-23 ENCOUNTER — Encounter: Payer: Medicaid Other | Admitting: Family Medicine

## 2014-05-01 ENCOUNTER — Other Ambulatory Visit: Payer: Medicaid Other

## 2014-05-01 DIAGNOSIS — Z8759 Personal history of other complications of pregnancy, childbirth and the puerperium: Secondary | ICD-10-CM

## 2014-05-01 LAB — HCG, QUANTITATIVE, PREGNANCY: hCG, Beta Chain, Quant, S: 312 m[IU]/mL

## 2014-05-01 NOTE — Progress Notes (Signed)
Patient here today for another follow up of bhcg. Patient denies bleeding or pain. Patient aware we will call with results and any needed further follow up

## 2014-05-15 ENCOUNTER — Other Ambulatory Visit: Payer: Self-pay

## 2014-05-15 DIAGNOSIS — O039 Complete or unspecified spontaneous abortion without complication: Secondary | ICD-10-CM

## 2014-05-15 NOTE — Progress Notes (Unsigned)
Patient here for repeat HCG. Last value on 05/01/14 312. Reports some spotting today-- had bleeding that was heavy at times after starting on 05/05/14 and has now decreased to spotting. Likely patient's period. Denies any pain, questions or concerns. Informed patient she will be called with results. Patient verbalized understanding.

## 2014-05-16 LAB — HCG, QUANTITATIVE, PREGNANCY

## 2014-05-18 ENCOUNTER — Telehealth: Payer: Self-pay

## 2014-05-18 NOTE — Telephone Encounter (Signed)
Pt called back informed her of results and followup appointment made by Beronica. Patient had no further questions.

## 2014-05-18 NOTE — Telephone Encounter (Signed)
Attempted to call patient with interpreter Cristela Blue. No answer. Left message stating we are calling to inform you of results, please call clinic. HCG <2 and will need follow up appointment with provider in 1-2 weeks. Message sent to admin pool to schedule appointment.

## 2014-05-18 NOTE — Telephone Encounter (Signed)
Message copied by Geanie Logan on Mon May 18, 2014 11:36 AM ------      Message from: Woodroe Mode      Created: Sun May 17, 2014  1:08 PM       F/u visit 1-2 weeks ------

## 2014-05-19 ENCOUNTER — Telehealth: Payer: Self-pay | Admitting: *Deleted

## 2014-05-19 NOTE — Telephone Encounter (Signed)
Called patient using language line spanish interpreter. Patient informed of results. She had no further questions.

## 2014-05-19 NOTE — Telephone Encounter (Signed)
Message copied by Mitchell Heir on Tue May 19, 2014  1:15 PM ------      Message from: Mora Bellman      Created: Tue May 19, 2014 10:47 AM       Please inform patient of complete resolution of miscarriage. Please remind patient to wait 3 normal cycles before trying to conceive again. She should start taking prenatal vitamins now ------

## 2014-06-01 ENCOUNTER — Encounter: Payer: Self-pay | Admitting: Obstetrics and Gynecology

## 2014-06-12 ENCOUNTER — Encounter: Payer: Self-pay | Admitting: Nurse Practitioner

## 2014-06-12 ENCOUNTER — Ambulatory Visit (INDEPENDENT_AMBULATORY_CARE_PROVIDER_SITE_OTHER): Payer: Self-pay | Admitting: Nurse Practitioner

## 2014-06-12 VITALS — BP 109/69 | HR 76 | Temp 98.5°F | Ht 60.63 in | Wt 124.7 lb

## 2014-06-12 DIAGNOSIS — A499 Bacterial infection, unspecified: Secondary | ICD-10-CM

## 2014-06-12 DIAGNOSIS — N760 Acute vaginitis: Secondary | ICD-10-CM

## 2014-06-12 DIAGNOSIS — O039 Complete or unspecified spontaneous abortion without complication: Secondary | ICD-10-CM | POA: Insufficient documentation

## 2014-06-12 DIAGNOSIS — B9689 Other specified bacterial agents as the cause of diseases classified elsewhere: Secondary | ICD-10-CM

## 2014-06-12 NOTE — Addendum Note (Signed)
Addended by: Michel Harrow on: 06/12/2014 10:53 AM   Modules accepted: Orders

## 2014-06-12 NOTE — Patient Instructions (Signed)
Vaginosis bacteriana (Bacterial Vaginosis) La vaginosis bacteriana es una infeccin vaginal que perturba el equilibrio normal de las bacterias que se encuentran en la vagina. Es el resultado de un crecimiento excesivo de ciertas bacterias. Esta es la infeccin vaginal ms frecuente en mujeres en edad reproductiva. El tratamiento es importante para prevenir complicaciones, especialmente en mujeres embarazadas, dado que puede causar un parto prematuro. CAUSAS  La vaginosis bacteriana se origina por un aumento de bacterias nocivas que, generalmente, estn presentes en cantidades ms pequeas en la vagina. Varios tipos diferentes de bacterias pueden causar esta afeccin. Sin embargo, la causa de su desarrollo no se comprende totalmente. [place] o comportamientos pueden exponerlo a un mayor riesgo de desarrollar vaginosis bacteriana, entre los que se incluyen:  Tener una nueva pareja sexual o mltiples parejas sexuales.  Las duchas vaginales  El uso del DIU (dispositivo intrauterino) como mtodo anticonceptivo. El contagio no se produce en baos, por ropas de cama, en piscinas o por contacto con objetos. SIGNOS Y SNTOMAS  Algunas mujeres que padecen vaginosis bacteriana no presentan signos ni sntomas. Los sntomas ms comunes son:  Secrecin vaginal de color grisceo.  Secrecin vaginal con olor similar al [employer], especialmente despus de Retail banker.  Picazn o sensacin de ardor en la vagina o la vulva.  Ardor o dolor al [employer]. DIAGNSTICO  Su mdico analizar su historia clnica y le examinar la vagina para detectar signos de vaginosis bacteriana. Puede tomarle Truddie Coco de flujo vaginal. Su mdico examinar esta muestra con un microscopio para controlar las bacterias y clulas anormales. Tambin puede realizarse un anlisis del pH vaginal.  TRATAMIENTO  La vaginosis bacteriana puede tratarse con antibiticos, en forma de comprimidos o  de crema vaginal. Puede indicarse una segunda tanda de antibiticos si la afeccin se repite despus del tratamiento.  [place] solo medicamentos de venta libre o recetados, segn las indicaciones del mdico.  Si le han recetado antibiticos, tmelos como se le indic. Asegrese de que finaliza la prescripcin completa aunque se sienta mejor.  No mantenga relaciones sexuales Animator.  Comunique a sus compaeros sexuales que sufre una infeccin vaginal. Deben consultar a su mdico y recibir tratamiento si tienen problemas, como picazn o una erupcin cutnea leve.  Practique el sexo seguro usando preservativos y tenga un nico compaero sexual. SOLICITE ATENCIN MDICA SI:   Sus sntomas no mejoran despus de 3 das de [place].  Aumenta la secrecin o Conservation officer, historic buildings.  Tiene fiebre. ASEGRESE DE QUE:   Comprende estas instrucciones.  Controlar su afeccin.  Recibir ayuda de inmediato si no mejora o si empeora. PARA OBTENER MS INFORMACIN  Centros para el control y la prevencin de Probation officer for Disease Control and Prevention, CDC): AppraiserFraud.fi Asociacin Estadounidense de la Salud Sexual (American Sexual Health Association, SHA): www.ashastd.org  Document Released: 10/24/2007 Document Revised: 05/07/2013 [facility] Patient Information 2015 [place], [place]. This information is not intended to replace advice given to you by your health care provider. Make sure you discuss any questions you have with your health care provider.

## 2014-06-12 NOTE — Progress Notes (Signed)
History:  Jacqueline Walter is a 33 y.o. G2P1001 who presents to [facility] clinic today for vaginal discharge that has been ongoing for a few days. She is using condoms for birth control following an SAB in August 2015. She is " scared " to become pregnant again but has not decided on longer term birth control. No partner change. Her menses is back to normal.   The following portions of the patient's history were reviewed and updated as appropriate: allergies, current medications, past family history, past medical history, past social history, past surgical history and problem list.  Review of Systems:    Objective:  Physical Exam BP 109/69 mmHg  Pulse 76  Temp(Src) 98.5 F (36.9 C) (Oral)  Ht 5' 0.63" (1.54 m)  Wt 124 lb 11.2 oz (56.564 kg)  BMI 23.85 kg/m2  LMP 11/19/2013 GENERAL: Well-developed, well-nourished female in no acute distress.  HEENT: Normocephalic, atraumatic.  ABDOMEN: Soft, nontender, nondistended. No organomegaly. Normal bowel sounds appreciated in all quadrants.  PELVIC: Normal external female genitalia. Vagina is pink and rugated.  Normal discharge Some odor.  Normal cervix contour.  Uterus is normal in size. No adnexal mass or tenderness.  EXTREMITIES: No cyanosis, clubbing, or edema, 2+ distal pulses.   Labs and Imaging No results found.   Assessment & Plan:  Assessment:  Bacterial Vaginosis/ likely Contraception SAB follow up  Plans:  Wet Prep/ will call her with results if positive Plans to use condoms for now No further issues with SAB RTC PRN  Olegario Messier, NP 06/12/2014 8:45 AM

## 2014-06-13 LAB — WET PREP, GENITAL
CLUE CELLS WET PREP: NONE SEEN
TRICH WET PREP: NONE SEEN
WBC WET PREP: NONE SEEN
YEAST WET PREP: NONE SEEN

## 2014-10-22 ENCOUNTER — Inpatient Hospital Stay (HOSPITAL_COMMUNITY)
Admission: AD | Admit: 2014-10-22 | Discharge: 2014-10-22 | Disposition: A | Discharge status: Home / Self Care | Payer: Self-pay | Source: Ambulatory Visit | Attending: Family Medicine | Admitting: Family Medicine

## 2014-10-22 DIAGNOSIS — O039 Complete or unspecified spontaneous abortion without complication: Secondary | ICD-10-CM | POA: Insufficient documentation

## 2014-10-22 LAB — URINALYSIS, ROUTINE W REFLEX MICROSCOPIC
Bilirubin Urine: NEGATIVE
GLUCOSE, UA: NEGATIVE mg/dL
Ketones, ur: NEGATIVE mg/dL
Leukocytes, UA: NEGATIVE
Nitrite: NEGATIVE
PH: 7.5 (ref 5.0–8.0)
Protein, ur: NEGATIVE mg/dL
SPECIFIC GRAVITY, URINE: 1.015 (ref 1.005–1.030)
Urobilinogen, UA: 0.2 mg/dL (ref 0.0–1.0)

## 2014-10-22 LAB — WET PREP, GENITAL
Clue Cells Wet Prep HPF POC: NONE SEEN
Trich, Wet Prep: NONE SEEN
WBC, Wet Prep HPF POC: NONE SEEN
Yeast Wet Prep HPF POC: NONE SEEN

## 2014-10-22 LAB — CBC
HCT: 38.2 % (ref 36.0–46.0)
Hemoglobin: 12.9 g/dL (ref 12.0–15.0)
MCH: 27.9 pg (ref 26.0–34.0)
MCHC: 33.8 g/dL (ref 30.0–36.0)
MCV: 82.5 fL (ref 78.0–100.0)
Platelets: 270 10*3/uL (ref 150–400)
RBC: 4.63 MIL/uL (ref 3.87–5.11)
RDW: 14.3 % (ref 11.5–15.5)
WBC: 8.8 10*3/uL (ref 4.0–10.5)

## 2014-10-22 LAB — POCT PREGNANCY, URINE: Preg Test, Ur: NEGATIVE

## 2014-10-22 LAB — HCG, QUANTITATIVE, PREGNANCY: hCG, Beta Chain, Quant, S: 13 m[IU]/mL — ABNORMAL HIGH (ref <5)

## 2014-10-22 LAB — URINE MICROSCOPIC-ADD ON

## 2014-10-22 MED ORDER — IBUPROFEN 800 MG PO TABS: 800.0000 mg | ORAL_TABLET | Freq: Three times a day (TID) | ORAL | Status: DC | PRN

## 2014-10-22 NOTE — Progress Notes (Signed)
Moderate amount of blood noted,Tissue from cervix sent to pathology

## 2014-10-22 NOTE — Discharge Instructions (Signed)
Aborto espontneo  (Miscarriage) El aborto espontneo es la prdida de un beb que no ha nacido (feto) antes de la semana 20 del Media planner. La mayor parte de estos abortos ocurre en los primeros 3 meses. En algunos casos ocurre antes de que la mujer sepa que est [place]. Tambin se denomina "aborto espontneo" o "prdida prematura del embarazo". El aborto espontneo puede ser Ardelia Mems experiencia que afecte emocionalmente a Geologist, engineering. Converse con su mdico si tiene dudas, cmo es el proceso de [place], y sobre planes futuros de Media planner.  CAUSAS   Algunos problemas cromosmicos pueden hacer imposible que el beb se desarrolle normalmente. Los problemas con los genes o cromosomas del beb son generalmente el resultado de errores que se producen, por casualidad, cuando el embrin se divide y crece. Estos problemas no se heredan de los [place].  Infeccin en el cuello del tero.   Problemas hormonales.   Problemas en el cuello del tero, como tener un tero incompetente. Esto ocurre cuando los tejidos no son lo suficientemente fuertes como para Risk manager.   Problemas del tero, como un tero con forma anormal, los fibromas o anormalidades congnitas.   Ciertas enfermedades crnicas.   No fume, no beba alcohol, ni consuma drogas.   Traumatismos  A veces, la causa es desconocida.  SNTOMAS   Sangrado o manchado vaginal, con o sin clicos o dolor.  Dolor o clicos en el abdomen o en la cintura.  Eliminacin de lquido, tejidos o cogulos grandes por la vagina. DIAGNSTICO  El [employer] har un examen fsico. Tambin le indicar una ecografa para confirmar el aborto. Es posible que se realicen anlisis de [place].  TRATAMIENTO   En algunos casos el tratamiento no es necesario, si se eliminan naturalmente todos los tejidos embrionarios que se encontraban en el tero. Si el feto o la placenta quedan dentro del tero (aborto incompleto), pueden infectarse, los tejidos que quedan  pueden infectarse y deben retirarse. Generalmente se realiza un procedimiento de dilatacin y curetaje (D y C). Durante el procedimiento de dilatacin y curetaje, el cuello del tero se abre (dilata) y se retira cualquier resto de tejido fetal o placentario del tero.  Si hay una infeccin, le recetarn antibiticos. Podrn recetarle otros medicamentos para reducir el tamao del tero (contraerlo) si hay una mucho sangrado.  Si su sangre es Rh negativa y su beb es Rh positivo, usted necesitar la inyeccin de inmunoglobulina Rh. Esta inyeccin proteger a los futuros bebs de tener problemas de compatibilidad Rh en futuros embarazos. INSTRUCCIONES PARA EL CUIDADO EN EL HOGAR   El mdico le indicar reposo en cama o le permitir Automotive engineer. Vuelva a la actividad lentamente o segn las indicaciones de su mdico.  Pdale a alguien que la ayude con las responsabilidades familiares y del hogar durante este tiempo.   Lleve un registro de la cantidad y la saturacin de las toallas higinicas que Medical laboratory scientific officer. Anote esta informacin   No use tampones. No No se haga duchas vaginales ni tenga relaciones sexuales hasta que el mdico la autorice.   Slo tome medicamentos de venta libre o recetados para Glass blower/designer o Health and safety inspector, segn las indicaciones de su mdico.   No tome aspirina. La aspirina puede ocasionar hemorragias.   Concurra puntualmente a las citas de control con el mdico.   Si usted o su pareja tienen dificultades con el duelo, hable con su mdico para buscar la ayuda psicolgica que los ayude a enfrentar la prdida  del embarazo. Permtase el tiempo suficiente de duelo antes de quedar embarazada nuevamente.  SOLICITE ATENCIN MDICA DE INMEDIATO SI:   Siente calambres intensos o dolor en la espalda o en el abdomen.  Tiene fiebre.  Elimina grandes cogulos de [place] (del tamao de una nuez o ms) o tejidos por la vagina. Guarde lo que ha eliminado para  que su mdico lo examine.   La hemorragia aumenta.   Margette Fast secrecin vaginal espesa y con mal olor.  Se siente mareada, dbil, o se desmaya.   Siente escalofros.  ASEGRESE DE QUE:   Comprende estas instrucciones.  Controlar su enfermedad.  Solicitar ayuda de inmediato si no mejora o si empeora. Document Released: 04/26/2005 Document Revised: 11/11/2012 [facility] Patient Information 2015 [place], [place]. This information is not intended to replace advice given to you by your health care provider. Make sure you discuss any questions you have with your health care provider.

## 2014-10-22 NOTE — MAU Provider Note (Signed)
History     CSN: [id number]  Arrival date and time: 10/22/14 1701   First Provider Initiated Contact with Patient 10/22/14 2044      Chief Complaint  Patient presents with  . Vaginal Bleeding   HPI Comments: Jacqueline Walter is a 34 y.o. G2P1001 at [redacted]w[redacted]d who presents today with vaginal bleeding. She was seen at the HD last week and had a +UPT there. She states that at 0600 today she started bleeding. She states that the bleeding was heavier than a period and she passed several clots. She states that her abdominal pain is 4/10 at this time.   Vaginal Bleeding This is a new problem. The current episode started today. The problem has been unchanged. The pain is moderate. The problem affects both sides. She is pregnant. Associated symptoms include abdominal pain. Pertinent negatives include no constipation, diarrhea, dysuria, fever, frequency, nausea, urgency or vomiting. She is sexually active. She uses nothing for contraception. Her menstrual history has been regular.    Past Medical History  Diagnosis Date  . Breast mass, left     Past Surgical History  Procedure Laterality Date  . Mass excision Left 03/19/2013    Procedure: excision of extraneous breast/fat tissue left axilla;  Surgeon: Haywood Lasso, MD;  Location: [place];  Service: General;  Laterality: Left;    Family History  Problem Relation Age of Onset  . Colon cancer Maternal Grandmother   . Cancer Maternal Grandmother     unknown    History  Substance Use Topics  . Smoking status: Never Smoker   . Smokeless tobacco: Never Used  . Alcohol Use: No    Allergies: No Known Allergies  Prescriptions prior to admission  Medication Sig Dispense Refill Last Dose  . Prenatal Vit-Fe Fumarate-FA (PRENATAL MULTIVITAMIN) TABS tablet Take 1 tablet by mouth every morning.   10/21/2014 at Unknown time  . ibuprofen (ADVIL,MOTRIN) 800 MG tablet Take 1 tablet (800 mg total) by mouth 3 (three) times  daily. (Patient not taking: Reported on 10/22/2014) 60 tablet 0 Not Taking at Unknown time  . misoprostol (CYTOTEC) 200 MCG tablet Take 1 tablet (200 mcg total) by mouth 4 (four) times daily. Place all four tablets into vagina at one time (Patient not taking: Reported on 10/22/2014) 4 tablet 1 Not Taking at Unknown time  . oxyCODONE-acetaminophen (PERCOCET/ROXICET) 5-325 MG per tablet Take 2 tablets by mouth every 4 (four) hours as needed for moderate pain or severe pain. (Patient not taking: Reported on 10/22/2014) 10 tablet 0 Not Taking at Unknown time  . promethazine (PHENERGAN) 25 MG tablet Take 1 tablet (25 mg total) by mouth every 6 (six) hours as needed for nausea or vomiting. (Patient not taking: Reported on 10/22/2014) 30 tablet 0 Not Taking at Unknown time    Review of Systems  Constitutional: Negative for fever.  Gastrointestinal: Positive for abdominal pain. Negative for nausea, vomiting, diarrhea and constipation.  Genitourinary: Positive for vaginal bleeding. Negative for dysuria, urgency and frequency.   Physical Exam   Blood pressure 111/74, pulse 76, temperature 98.8 F (37.1 C), temperature source Oral, resp. rate 16, height 5\' 3"  (1.6 m), weight 58.287 kg (128 lb 8 oz), last menstrual period 09/09/2014, SpO2 100 %, not currently breastfeeding.  Physical Exam  Nursing note and vitals reviewed. Constitutional: She is oriented to person, place, and time. She appears well-developed and well-nourished. No distress.  Cardiovascular: Normal rate.   Respiratory: Effort normal.  GI: Soft. There is no tenderness.  There is no rebound.  Genitourinary:  External: no lesion Vagina: small amount of blood seen  Cervix: pink, smooth, small amount of tissue at the cervical os. Removed completely. Will send to path.  Uterus: NSSC Adnexa: NT   Neurological: She is alert and oriented to person, place, and time.  Skin: Skin is warm and dry.  Psychiatric: She has a normal mood and affect.    Results for orders placed or performed during the hospital encounter of 10/22/14 (from the past 24 hour(s))  Urinalysis, Routine w reflex microscopic     Status: Abnormal   Collection Time: 10/22/14  6:20 PM  Result Value Ref Range   Color, Urine YELLOW YELLOW   APPearance HAZY (A) CLEAR   Specific Gravity, Urine 1.015 1.005 - 1.030   pH 7.5 5.0 - 8.0   Glucose, UA NEGATIVE NEGATIVE mg/dL   Hgb urine dipstick LARGE (A) NEGATIVE   Bilirubin Urine NEGATIVE NEGATIVE   Ketones, ur NEGATIVE NEGATIVE mg/dL   Protein, ur NEGATIVE NEGATIVE mg/dL   Urobilinogen, UA 0.2 0.0 - 1.0 mg/dL   Nitrite NEGATIVE NEGATIVE   Leukocytes, UA NEGATIVE NEGATIVE  Urine microscopic-add on     Status: Abnormal   Collection Time: 10/22/14  6:20 PM  Result Value Ref Range   Squamous Epithelial / LPF FEW (A) RARE   RBC / HPF TOO NUMEROUS TO COUNT <3 RBC/hpf   Bacteria, UA FEW (A) RARE   Urine-Other AMORPHOUS URATES/PHOSPHATES   Pregnancy, urine POC     Status: None   Collection Time: 10/22/14  7:14 PM  Result Value Ref Range   Preg Test, Ur NEGATIVE NEGATIVE  hCG, quantitative, pregnancy     Status: Abnormal   Collection Time: 10/22/14  7:47 PM  Result Value Ref Range   hCG, Beta Chain, Quant, S 13 (H) <5 mIU/mL  CBC     Status: None   Collection Time: 10/22/14  7:50 PM  Result Value Ref Range   WBC 8.8 4.0 - 10.5 K/uL   RBC 4.63 3.87 - 5.11 MIL/uL   Hemoglobin 12.9 12.0 - 15.0 g/dL   HCT 38.2 36.0 - 46.0 %   MCV 82.5 78.0 - 100.0 fL   MCH 27.9 26.0 - 34.0 pg   MCHC 33.8 30.0 - 36.0 g/dL   RDW 14.3 11.5 - 15.5 %   Platelets 270 150 - 400 K/uL    MAU Course  Procedures  MDM   Assessment and Plan   1. SAB (spontaneous abortion)    DC home HGB is within range HCG is is very low Uterine contents sent to pathology  Bleeding precautions reviewed SAB precautions FU in the clinic in one week Return to MAU sooner if needed  Follow-up Information    Follow up with [facility].   Specialty:  Obstetrics and Gynecology   Why:  Friday 11/09/14 between [place] and 11AM    Contact information:   [place] [phone]       Mathis Bud 10/22/2014, 9:11 PM

## 2014-10-22 NOTE — MAU Note (Signed)
Pt states had positive upt last Thursday at [facility]. Began bleeding at 0600 this am, passed large clot, and throughout day has passed smaller clots. Cramping at present in middle of lower abdomen.

## 2014-10-23 LAB — GC/CHLAMYDIA PROBE AMP (~~LOC~~) NOT AT ARMC
Chlamydia: NEGATIVE
Neisseria Gonorrhea: NEGATIVE

## 2014-10-28 ENCOUNTER — Telehealth: Payer: Self-pay | Admitting: *Deleted

## 2014-10-28 NOTE — Telephone Encounter (Signed)
Called patient with [employer] for interpreter, no answer- left message stating we are trying to reach you with results and to set up an appt. Called patient's sister and left message with her to have patient call us. She stated she would

## 2014-10-28 NOTE — Telephone Encounter (Signed)
-----   Message from Donnamae Jude, MD sent at 10/27/2014  8:29 AM EDT ----- Her pathology does not show villi--will need BHCG.

## 2014-10-29 ENCOUNTER — Encounter: Payer: Self-pay | Admitting: *Deleted

## 2014-10-29 ENCOUNTER — Other Ambulatory Visit: Payer: Self-pay

## 2014-10-29 DIAGNOSIS — O200 Threatened abortion: Secondary | ICD-10-CM

## 2014-10-29 LAB — HCG, QUANTITATIVE, PREGNANCY

## 2014-10-29 NOTE — Telephone Encounter (Signed)
Attempted To Call Patient with interpreter Alviris Almonte, No answer pt was given instructions to call clinic for results.    Pt called clinic and spoke with Alviris Almonte, informed pt she needs to come in for Lab Draw. Pt states she can come today at [facility] 10/29/14

## 2015-06-03 IMAGING — US US OB TRANSVAGINAL
1 series · 13 of 28 positions shown · non-contrast
Comparison: 03/21/2014 ultrasound

CLINICAL DATA: 33-year-old female with vaginal bleeding and
increasing pain. Patient had recent ultrasound suspicious for failed
first trimester pregnancy.

EXAM:
TRANSVAGINAL OB ULTRASOUND
TECHNIQUE: Transvaginal ultrasound was performed for complete evaluation of the
gestation as well as the maternal uterus, adnexal regions, and
pelvic cul-de-sac.

[Series 1: us ob transvaginal · 0.12mm/px · 13 of 33 slices shown]
[im 2/33]
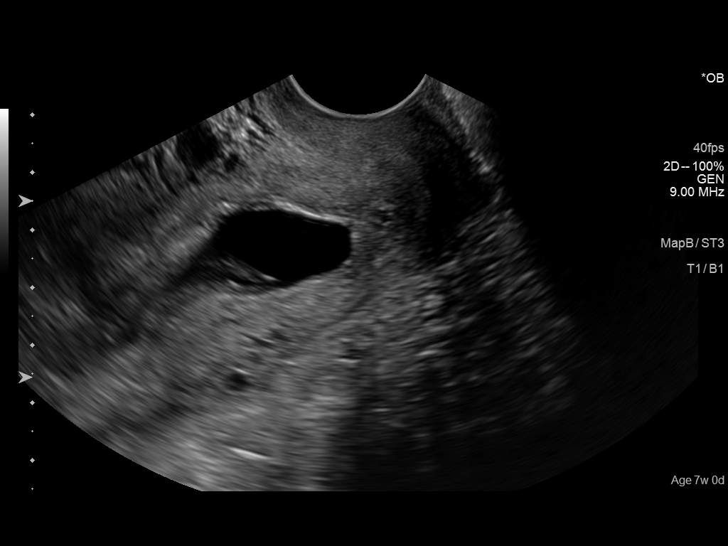
[im 4/33]
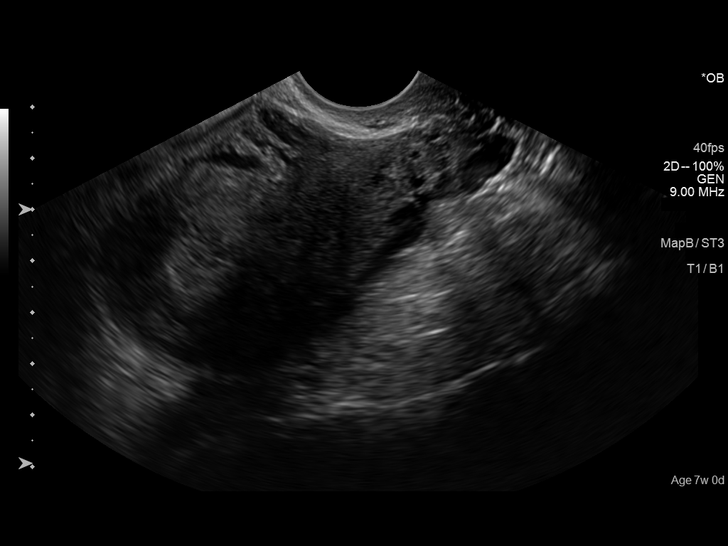
[im 6/33]
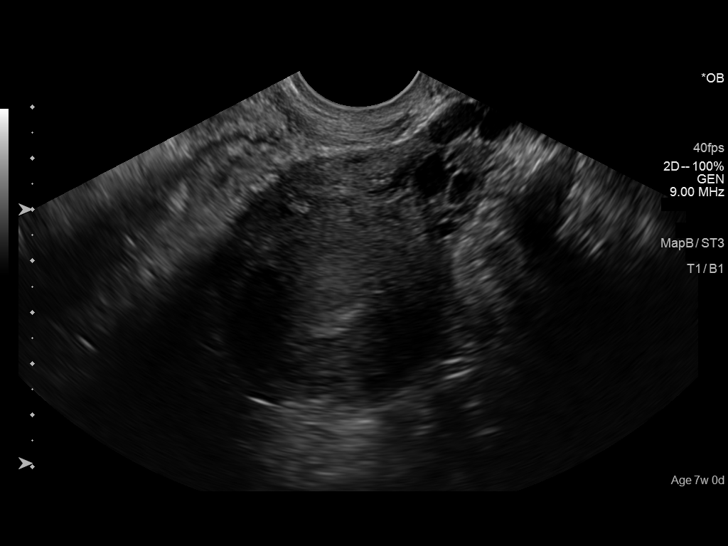
[im 9/33]
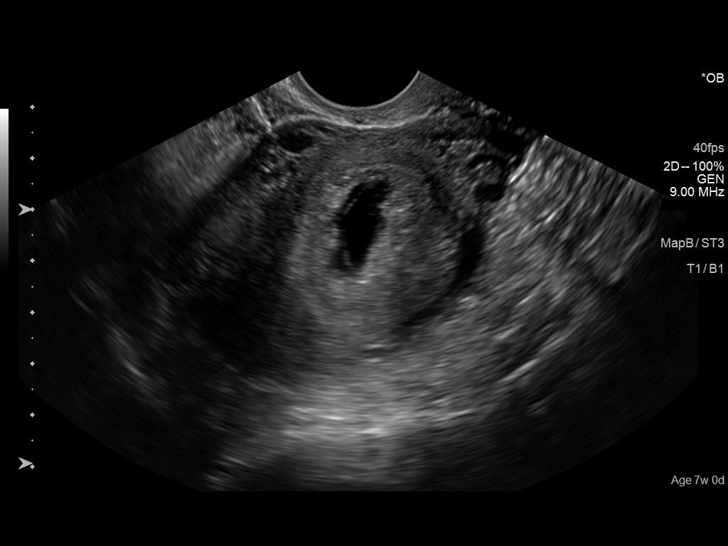
[im 11/33]
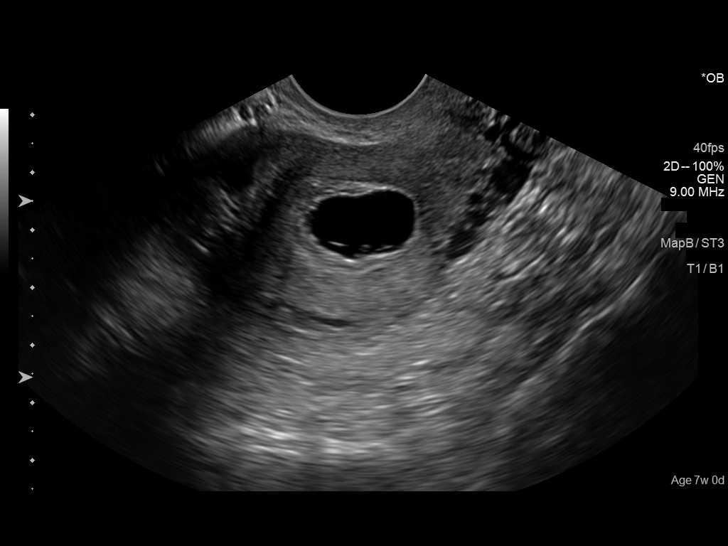
[im 14/33]
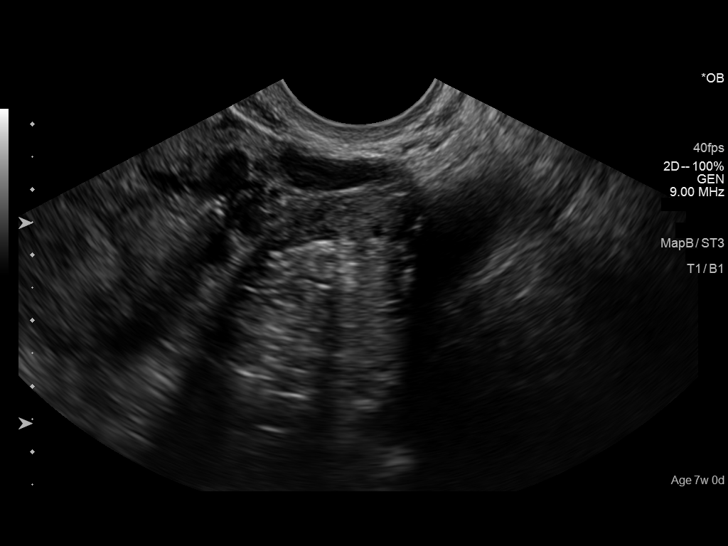
[im 17/33]
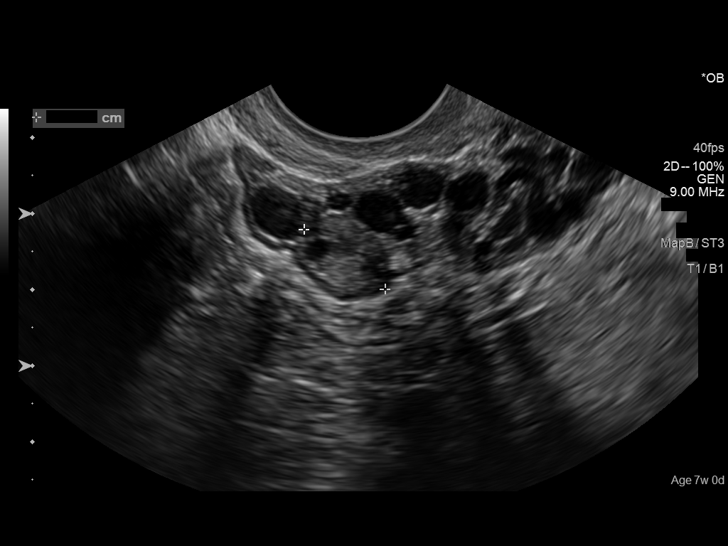
[im 19/33]
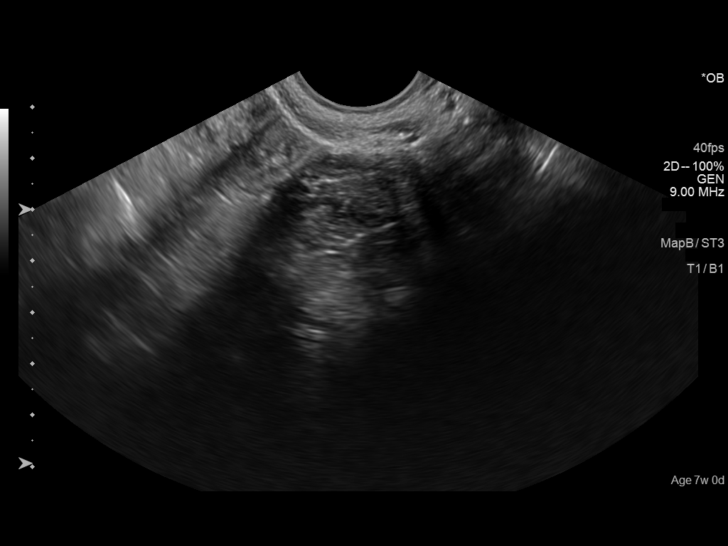
[im 22/33]
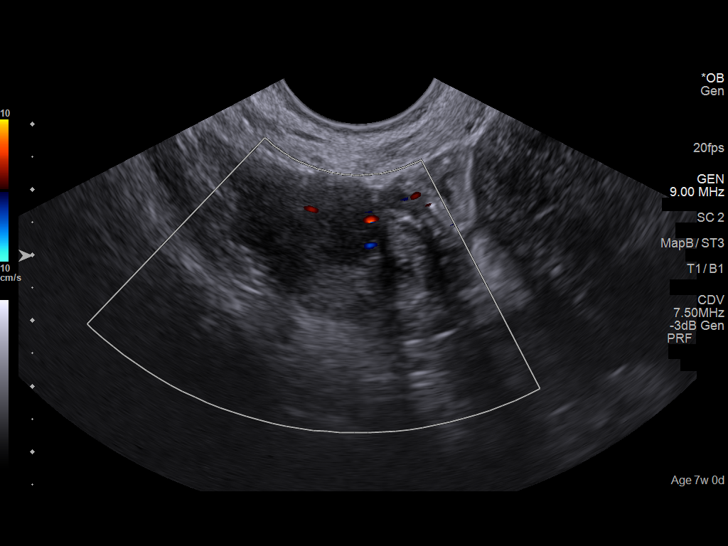
[im 24/33]
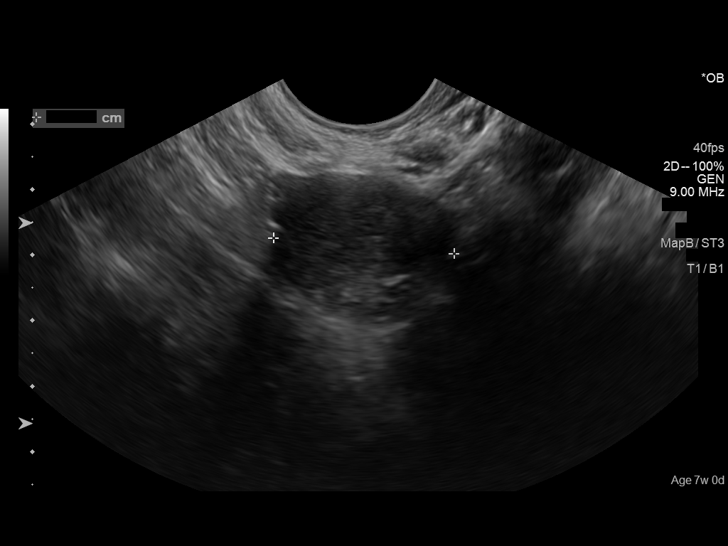
[im 27/33]
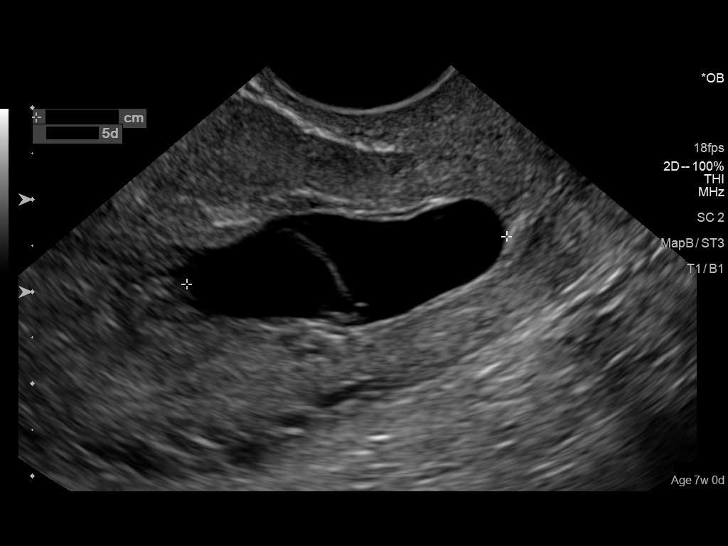
[im 29/33]
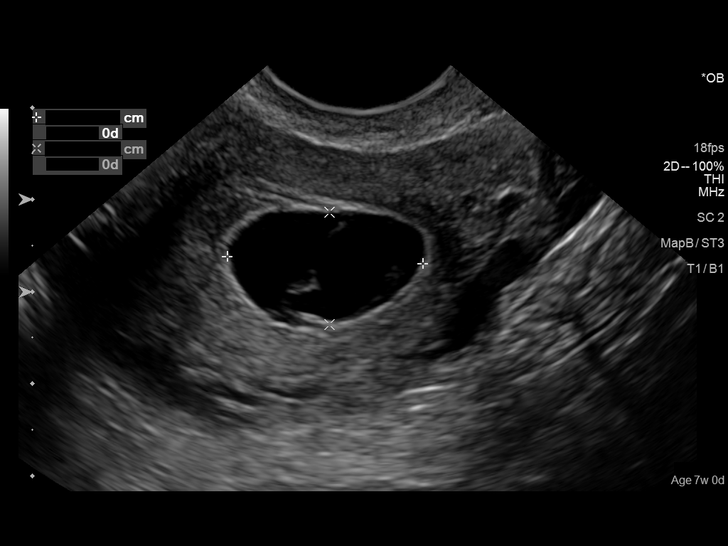
[im 31/33]
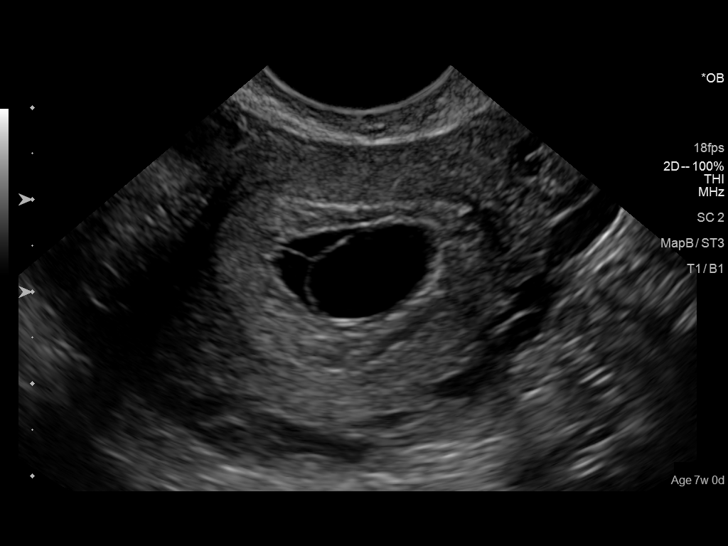

[13 of 28 positions shown; findings below may reference images not displayed]

FINDINGS: Intrauterine gestational sac: An irregular cystic structure with
internal septations is identified within the uterus.

Yolk sac:  Not visualized

Embryo:  Not visualized

Cardiac Activity: Not visualized

MSD: 22.8  mm   7 w   2  d

Maternal uterus/adnexae: There is no evidence of subchorionic
hemorrhage.

The ovaries bilaterally are unremarkable.

There is no evidence of free fluid or adnexal mass.
IMPRESSION: Irregular intrauterine cystic structure (22.8 mm MSD) without fetal
pole or yolk sac. Findings almost certainly represent but not yet
definitive for failed pregnancy. Recommend follow-up US in 10-14
days for definitive diagnosis. This recommendation follows SRU
consensus guidelines: Diagnostic Criteria for Nonviable Pregnancy
Early in the First Trimester. N Engl J Med 1900; [DATE].

## 2016-07-10 ENCOUNTER — Encounter (HOSPITAL_COMMUNITY): Payer: Self-pay | Admitting: Family Medicine

## 2016-07-10 ENCOUNTER — Ambulatory Visit (HOSPITAL_COMMUNITY)
Admission: EM | Admit: 2016-07-10 | Discharge: 2016-07-10 | Disposition: A | Discharge status: Home / Self Care | Payer: Self-pay | Attending: Family Medicine | Admitting: Family Medicine

## 2016-07-10 DIAGNOSIS — J029 Acute pharyngitis, unspecified: Secondary | ICD-10-CM

## 2016-07-10 MED ORDER — CHLORHEXIDINE GLUCONATE 0.12 % MT SOLN: 15.0000 mL | Freq: Two times a day (BID) | OROMUCOSAL | 0 refills | Status: DC

## 2016-07-10 MED ORDER — AMOXICILLIN 875 MG PO TABS: 875.0000 mg | ORAL_TABLET | Freq: Two times a day (BID) | ORAL | 0 refills | Status: DC

## 2016-07-10 NOTE — ED Provider Notes (Signed)
[place]    CSN: [id number] Arrival date & time: 07/10/16  1450     History   Chief Complaint No chief complaint on file.   HPI Jacqueline Walter is a 35 y.o. female.   This Is a 35 year old woman who comes in to the [facility] urgent care center for evaluation of a sore throat. She works at [employer]. She says that her throat has been hurting for about 12 days and the sore throat radiates down her esophagus. She's had some mild swelling in her glands but no fever or cough.      Past Medical History:  Diagnosis Date  . Breast mass, left     Patient Active Problem List   Diagnosis Date Noted  . SAB (spontaneous abortion) 06/12/2014  . BENIGN NEOPLASM OF BREAST 03/31/2010  . OTHER ABNORMAL GLUCOSE 12/29/2009  . BACK PAIN, LUMBAR 11/05/2009  . VAGINAL DISCHARGE 09/06/2009    Past Surgical History:  Procedure Laterality Date  . MASS EXCISION Left 03/19/2013   Procedure: excision of extraneous breast/fat tissue left axilla;  Surgeon: Haywood Lasso, MD;  Location: [place];  Service: General;  Laterality: Left;    OB History    Gravida Para Term Preterm AB Living   2 1 1     1    SAB TAB Ectopic Multiple Live Births           1       Home Medications    Prior to Admission medications   Medication Sig Start Date End Date Taking? Authorizing Provider  ibuprofen (ADVIL,MOTRIN) 800 MG tablet Take 1 tablet (800 mg total) by mouth every 8 (eight) hours as needed. 10/22/14   Tresea Mall, CNM  Prenatal Vit-Fe Fumarate-FA (PRENATAL MULTIVITAMIN) TABS tablet Take 1 tablet by mouth every morning.    Historical Provider, MD    Family History Family History  Problem Relation Age of Onset  . Colon cancer Maternal Grandmother   . Cancer Maternal Grandmother     unknown    Social History Social History  Substance Use Topics  . Smoking status: Never Smoker  . Smokeless tobacco: Never Used  .  Alcohol use No     Allergies   Patient has no known allergies.   Review of Systems Review of Systems  Constitutional: Negative.   HENT: Positive for sore throat.   Respiratory: Negative.   Cardiovascular: Negative.   Gastrointestinal: Negative.   Musculoskeletal: Negative.   Neurological: Negative.      Physical Exam Triage Vital Signs ED Triage Vitals  Enc Vitals Group     BP      Pulse      Resp      Temp      Temp src      SpO2      Weight      Height      Head Circumference      Peak Flow      Pain Score      Pain Loc      Pain Edu?      Excl. in [place]?    No data found.   Updated Vital Signs There were no vitals taken for this visit.   Physical Exam  Constitutional: She is oriented to person, place, and time. She appears well-developed and well-nourished.  HENT:  Right Ear: External ear normal.  Left Ear: External ear normal.  Patient has mild posterior pharynx  redness with a Concretion on the right side.  Eyes: Conjunctivae and EOM are normal.  Neck: Normal range of motion. Neck supple.  Pulmonary/Chest: Effort normal.  Musculoskeletal: Normal range of motion.  Lymphadenopathy:    She has cervical adenopathy.  Neurological: She is alert and oriented to person, place, and time.  Skin: Skin is warm and dry.  Nursing note and vitals reviewed.    UC Treatments / Results  Labs (all labs ordered are listed, but only abnormal results are displayed) Labs Reviewed - No data to display  EKG  EKG Interpretation None       Radiology No results found.  Procedures Procedures (including critical care time)  Medications Ordered in UC Medications - No data to display   Initial Impression / Assessment and Plan / UC Course  I have reviewed the triage vital signs and the nursing notes.  Pertinent labs & imaging results that were available during my care of the patient were reviewed by me and considered in my medical decision making (see chart  for details).  Clinical Course     Final Clinical Impressions(s) / UC Diagnoses   Final diagnoses:  None    New Prescriptions New Prescriptions   No medications on file     Robyn Haber, MD 07/10/16 1533

## 2016-07-10 NOTE — ED Triage Notes (Signed)
sorethroat     And  painfull  Swallowing  X   12  Days

## 2016-07-31 NOTE — L&D Delivery Note (Signed)
Patient is 36 y.o. [id number] [redacted]w[redacted]d admitted for IOL for postdates. S/p IOL with foley bulb, cytotec, followed by Pitocin. AROM at 1140.  Prenatal course also complicated by AMA.  Delivery Note At 3:37 PM a viable and healthy female was delivered via Vaginal, Spontaneous (Presentation: cephalic;LOA  ).  APGAR: 8,9 ; weight pending .   Placenta status: intact,spontaneous .  Cord:  3 vessel  Anesthesia:  epidural Episiotomy: None Lacerations:  none Est. Blood Loss (mL): 450  Mom to postpartum.  Baby to Couplet care / Skin to Skin.    Upon arrival, patient was complete. She pushed with good maternal effort to deliver a viable female infant in cephalic, LOA position over intact perineum. No nuchal cord present. Anterior shoulder delivered easily. Baby was noted to have good tone and place on maternal abdomen for oral suctioning, drying and stimulation. Delayed cord clamping performed. Placenta delivered spontaneously with gentle cord traction. Fundus firm with massage and Pitocin. Perineum inspected and found to have no laceration. Counts of sharps, instruments, and lap pads were all correct.   Dannielle Huh, DO [place] Fellow

## 2016-08-07 ENCOUNTER — Other Ambulatory Visit (HOSPITAL_COMMUNITY): Payer: Self-pay | Admitting: Family Medicine

## 2016-08-07 ENCOUNTER — Encounter: Payer: Self-pay | Admitting: Family Medicine

## 2016-08-07 ENCOUNTER — Ambulatory Visit: Payer: Self-pay | Attending: Family Medicine | Admitting: Family Medicine

## 2016-08-07 VITALS — BP 91/59 | HR 73 | Temp 98.2°F | Resp 18 | Ht 62.0 in | Wt 131.2 lb

## 2016-08-07 DIAGNOSIS — R1319 Other dysphagia: Secondary | ICD-10-CM

## 2016-08-07 DIAGNOSIS — Z79899 Other long term (current) drug therapy: Secondary | ICD-10-CM | POA: Insufficient documentation

## 2016-08-07 DIAGNOSIS — K219 Gastro-esophageal reflux disease without esophagitis: Secondary | ICD-10-CM | POA: Insufficient documentation

## 2016-08-07 DIAGNOSIS — R1310 Dysphagia, unspecified: Secondary | ICD-10-CM | POA: Insufficient documentation

## 2016-08-07 DIAGNOSIS — R0789 Other chest pain: Secondary | ICD-10-CM | POA: Insufficient documentation

## 2016-08-07 MED ORDER — RANITIDINE HCL 150 MG PO TABS: 150.0000 mg | ORAL_TABLET | Freq: Two times a day (BID) | ORAL | 1 refills | Status: DC

## 2016-08-07 NOTE — Patient Instructions (Signed)
Opciones de alimentos para pacientes con reflujo gastroesofgico - Adultos (Food Choices for Gastroesophageal Reflux Disease, Adult) Cuando se tiene reflujo gastroesofgico (ERGE), los alimentos que se ingieren y los hbitos de alimentacin son muy importantes. Elegir los alimentos adecuados puede ayudar a [employer]. [place]?  Elija las frutas, los vegetales, los cereales integrales y los productos lcteos con bajo contenido de [place].  Superior, de pescado y de ave con bajo contenido de grasas.  Limite las grasas, como los [place], los aderezos para [place], la [employer], los frutos secos y Publishing copy.  Lleve un registro de alimentos. Esto ayuda a identificar los alimentos que ocasionan sntomas.  Evite los alimentos que le ocasionen sntomas. Pueden ser distintos para cada persona.  Haga comidas pequeas durante Psychiatrist de 3 comidas abundantes.  Coma lentamente, en un lugar donde est distendido.  Limite el consumo de alimentos fritos.  Cocine los alimentos utilizando mtodos que no sean la fritura.  Evite el consumo alcohol.  Evite beber grandes cantidades de lquidos con las comidas.  Evite agacharse o recostarse hasta despus de 2 o 3horas de haber comido. QU ALIMENTOS NO SE RECOMIENDAN? Estos son algunos alimentos y bebidas que pueden empeorar los sntomas: Actor. Jugo de tomate. Salsa de tomate y espagueti. Ajes. [place] y [place]. Rbano picante. Frutas  Naranjas, pomelos y limn (fruta y [place]). Carnes  Carnes de [place], de pescado y de ave con gran contenido de grasas. Esto incluye los perros calientes, las [place], el [place], la salchicha, el salame y el tocino. Lcteos  Leche entera y [place]. [employer]. Crema. [place]. Helados. Queso crema. Bebidas  T o caf. Bebidas gaseosas o bebidas energizantes. Condimentos  Salsa picante. Salsa barbacoa. Dulces/postres  Chocolate y cacao.  [place]. Menta y mentol. Grasas y [employer]. Esto incluye las papas fritas. Otros  [place]. Especias picantes. Esto incluye la pimienta negra, la pimienta blanca, la pimienta roja, la pimienta de cayena, el curry en polvo, los clavos de [place], el jengibre y el [place] en polvo. Esta no es [employer] de los alimentos y las bebidas que se Higher education careers adviser. Comunquese con el nutricionista para recibir ms informacin.  Esta informacin no tiene Marine scientist el consejo del mdico. Asegrese de hacerle al mdico cualquier pregunta que tenga. Document Released: 01/16/2012 Document Revised: 08/07/2014 Document Reviewed: 05/21/2013 Elsevier Interactive Patient Education  2017 [place] (Dysphagia) La dificultad para tragar (disfagia) ocurre cuando los slidos y los lquidos parecen adherirse a Patent examiner en su paso hacia el [place], o los alimentos demoran mucho en llegar al [employer]. Otros sntomas son regurgitacin de la comida, ruidos que provienen de la garganta, molestias en el pecho al tragar y sensacin de plenitud o de que hay algo adherido en la garganta al tragar. Cuando la obstruccin en la garganta es completa puede asociarse a babeo. CAUSAS Los problemas para tragar pueden ocurrir debido a [employer]. Los alimentos no pueden ser [employer] de la manera habitual hacia el [place]. Puede tener lceras, tejido cicatrizal o inflamacin en el tubo por el que los alimentos descienden desde la boca al estmago (esfago), lo que obstruye a los alimentos en su paso normal hacia el [place]. Las causas de la inflamacin incluyen:  Reflujo cido desde el estmago hacia el esfago.  Infeccin.  Tratamiento de radiacin para Science writer.  Medicamentos que se toman sin la cantidad suficiente de lquido para  empujarlos hacia el estmago. Puede ser que tenga problemas neurolgicos que impidan que las seales se enven a los msculos del esfago  para que se contraigan y [place] la comida [place]. Un globo farngeo es un problema relativamente frecuente en el que hay una sensacin de obstruccin o dificultad para tragar, sin que haya ninguna anormalidad fsica en los conductos que intervienen en la deglucin. Este problema generalmente mejora con el tiempo, al reasegurar y [place] estudios para descartar otras causas. DIAGNSTICO La disfagia puede diagnosticarse y su causa puede determinarse con estudios en los que deber tragar una sustancia blanca que ayuda visualizar el interior de la garganta (medio de contraste) mientras se toman radiografas. En algunos casos se inserta un telescopio flexible (endoscopio) para observar el esfago y Product manager. TRATAMIENTO  Si la causa de la disfagia es el reflujo cido o una infeccin podrn indicarle medicamentos.  Si la causa de la disfagia son problemas en los msculos que intervienen en la deglucin, ser necesario seguir una terapia para fortalecer estos msculos.  Si la causa es una obstruccin o un bulto, se realizarn procedimientos para remover la obstruccin. INSTRUCCIONES PARA EL CUIDADO EN EL HOGAR  Trate de consumir alimentos que sean fciles de tragar y trate de controlar su peso diariamente para asegurarse de que no baje demasiado.  Asegrese de beber lquidos mientras se encuentre sentado erguido (no acostado). SOLICITE ATENCIN MDICA SI:  Pierde peso debido a que no puede tragar.  Tose al beber lquidos (aspiracin).  Tose y elimina comida parcialmente digerida. SOLICITE ATENCIN MDICA DE INMEDIATO SI:  No puede tragar su propia saliva.  Tiene dificultad para respirar, tiene fiebre o ambos.  Tiene ronquera junto a la dificultad para tragar. ASEGRESE DE QUE:  Comprende estas instrucciones.  Controlar su afeccin.  Recibir ayuda de inmediato si no mejora o si empeora. Esta informacin no tiene Marine scientist el consejo del mdico.  Asegrese de hacerle al mdico cualquier pregunta que tenga. Document Released: 04/26/2005 Document Revised: 03/19/2013 Document Reviewed: 01/03/2013 Elsevier Interactive Patient Education  2017 [employer].

## 2016-08-07 NOTE — Progress Notes (Signed)
   Subjective:  Patient ID: Jacqueline Walter, female    DOB: 1980-09-29  Age: 36 y.o. MRN: [id number]  CC: Establish Care   HPI Jacqueline Walter presents for throat problem. Reports difficulty swallowing foods. She states " I feel like there's something stuck in my throat." She reports sensation of delayed swallowing with chest discomfort for an hour after eating and having to take deep breaths to help swallow.  Denies history of throat trauma or mouth lesions. Denies abdominal pain. Denies changes to bowel or bladder habits. Reports burping and burning sensation in throat. Denies taking anything for symptoms.   Outpatient Medications Prior to Visit  Medication Sig Dispense Refill  . amoxicillin (AMOXIL) 875 MG tablet Take 1 tablet (875 mg total) by mouth 2 (two) times daily. 20 tablet 0  . chlorhexidine (PERIDEX) 0.12 % solution Use as directed 15 mLs in the mouth or throat 2 (two) times daily. 120 mL 0  . ibuprofen (ADVIL,MOTRIN) 800 MG tablet Take 1 tablet (800 mg total) by mouth every 8 (eight) hours as needed. 30 tablet 0   No facility-administered medications prior to visit.     ROS Review of Systems  HENT: Positive for trouble swallowing. Negative for voice change.   Respiratory: Negative.   Cardiovascular: Negative.   Gastrointestinal: Negative.    Objective:  LMP 06/22/2016   BP/Weight 07/10/2016 10/22/2014 99991111  Systolic BP [id number] 97 0000000  Diastolic BP 78 64 69  Wt. (Lbs) - 128.5 124.7  BMI - 22.77 23.85    Physical Exam  Constitutional: She is oriented to person, place, and time. No distress.  HENT:  Right Ear: External ear normal.  Left Ear: External ear normal.  Nose: Nose normal.  Mouth/Throat: Oropharynx is clear and moist. No oropharyngeal exudate.  Eyes: Pupils are equal, round, and reactive to light.  Neck: Normal range of motion. Neck supple. Tracheal tenderness: inferior neck. No tracheal deviation present. No thyroid mass and no  thyromegaly present.    Cardiovascular: Normal rate, regular rhythm, normal heart sounds and intact distal pulses.   Pulmonary/Chest: Effort normal and breath sounds normal.  Abdominal: Soft. Bowel sounds are normal.  Lymphadenopathy:    She has no cervical adenopathy.  Neurological: She is alert and oriented to person, place, and time.  Skin: Skin is warm and dry.   Assessment & Plan:   Problem List Items Addressed This Visit    None    Visit Diagnoses    Gastroesophageal reflux disease, esophagitis presence not specified    -  Primary   Relevant Medications   ranitidine (ZANTAC) 150 MG tablet   Dysphagia, unspecified type       Relevant Orders   Ambulatory referral to Gastroenterology   DG Esophagus   SLP modified barium swallow     Meds ordered this encounter  Medications  . ranitidine (ZANTAC) 150 MG tablet    Sig: Take 1 tablet (150 mg total) by mouth 2 (two) times daily.    Dispense:  60 tablet    Refill:  1    Order Specific Question:   Supervising Provider    Answer:   Tresa Garter [id number]   Follow-up: Return if symptoms worsen or fail to improve. Follow up in 2 months in about 2 months (around 10/05/2016), for GERD.   Alfonse Spruce FNP

## 2016-08-07 NOTE — Progress Notes (Signed)
Patient is here for establish care  Patient declined the flu shot today.

## 2016-08-15 ENCOUNTER — Ambulatory Visit: Payer: Self-pay | Attending: Family Medicine

## 2016-08-17 ENCOUNTER — Other Ambulatory Visit (HOSPITAL_COMMUNITY): Payer: Self-pay | Admitting: Interventional Radiology

## 2016-08-17 ENCOUNTER — Ambulatory Visit (HOSPITAL_COMMUNITY)
Admission: RE | Admit: 2016-08-17 | Discharge: 2016-08-17 | Disposition: A | Discharge status: Home / Self Care | Payer: Self-pay | Source: Ambulatory Visit | Attending: Family Medicine | Admitting: Family Medicine

## 2016-08-17 ENCOUNTER — Encounter (HOSPITAL_COMMUNITY): Payer: Self-pay | Admitting: Radiology

## 2016-08-17 DIAGNOSIS — R1310 Dysphagia, unspecified: Secondary | ICD-10-CM | POA: Insufficient documentation

## 2016-08-17 DIAGNOSIS — R1319 Other dysphagia: Secondary | ICD-10-CM

## 2016-08-17 NOTE — Progress Notes (Signed)
Modified Barium Swallow Progress Note  Patient Details  Name: Jacqueline Walter MRN: [id number] Date of Birth: 1980/09/12  Today's Date: 08/17/2016  Modified Barium Swallow completed.  Full report located under Chart Review in the Imaging Section.  Brief recommendations include the following:  Clinical Impression  Pt's oropharyngeal swallow is WFL with good airway protection. Pt did experience a globus sensation before and during testing, but there were no objective findings to account for these symptoms. Suspect a primary esophageal source. Pt scheduled for a barium swallow after MBS today to assess esophageal function. Will defer additional w/u to MD - no further SLP f/u indicated.   Swallow Evaluation Recommendations   Recommended Consults: Consider esophageal assessment   SLP Diet Recommendations: Regular solids;Thin liquid   Liquid Administration via: Cup;Straw   Medication Administration: Whole meds with liquid   Supervision: Patient able to self feed   Compensations: Slow rate;Small sips/bites;Follow solids with liquid   Postural Changes: Remain semi-upright after after feeds/meals (Comment);Seated upright at 90 degrees   Oral Care Recommendations: Oral care BID        Germain Osgood 08/17/2016,11:57 AM   Germain Osgood, M.A. CCC-SLP [phone]

## 2016-10-02 ENCOUNTER — Inpatient Hospital Stay (HOSPITAL_COMMUNITY)
Admission: AD | Admit: 2016-10-02 | Discharge: 2016-10-02 | Disposition: A | Discharge status: Home / Self Care | Payer: Self-pay | Source: Ambulatory Visit | Attending: Family Medicine | Admitting: Family Medicine

## 2016-10-02 ENCOUNTER — Inpatient Hospital Stay (HOSPITAL_COMMUNITY): Payer: Self-pay

## 2016-10-02 ENCOUNTER — Encounter (HOSPITAL_COMMUNITY): Payer: Self-pay | Admitting: *Deleted

## 2016-10-02 DIAGNOSIS — Z3491 Encounter for supervision of normal pregnancy, unspecified, first trimester: Secondary | ICD-10-CM

## 2016-10-02 DIAGNOSIS — R1030 Lower abdominal pain, unspecified: Secondary | ICD-10-CM | POA: Insufficient documentation

## 2016-10-02 DIAGNOSIS — O9989 Other specified diseases and conditions complicating pregnancy, childbirth and the puerperium: Secondary | ICD-10-CM

## 2016-10-02 DIAGNOSIS — Z8249 Family history of ischemic heart disease and other diseases of the circulatory system: Secondary | ICD-10-CM | POA: Insufficient documentation

## 2016-10-02 DIAGNOSIS — Z800 Family history of malignant neoplasm of digestive organs: Secondary | ICD-10-CM | POA: Insufficient documentation

## 2016-10-02 DIAGNOSIS — R109 Unspecified abdominal pain: Secondary | ICD-10-CM

## 2016-10-02 DIAGNOSIS — N63 Unspecified lump in unspecified breast: Secondary | ICD-10-CM | POA: Insufficient documentation

## 2016-10-02 DIAGNOSIS — O26891 Other specified pregnancy related conditions, first trimester: Secondary | ICD-10-CM | POA: Insufficient documentation

## 2016-10-02 DIAGNOSIS — O208 Other hemorrhage in early pregnancy: Secondary | ICD-10-CM | POA: Insufficient documentation

## 2016-10-02 DIAGNOSIS — Z3A08 8 weeks gestation of pregnancy: Secondary | ICD-10-CM | POA: Insufficient documentation

## 2016-10-02 DIAGNOSIS — Z833 Family history of diabetes mellitus: Secondary | ICD-10-CM | POA: Insufficient documentation

## 2016-10-02 DIAGNOSIS — O468X1 Other antepartum hemorrhage, first trimester: Secondary | ICD-10-CM

## 2016-10-02 DIAGNOSIS — O418X10 Other specified disorders of amniotic fluid and membranes, first trimester, not applicable or unspecified: Secondary | ICD-10-CM

## 2016-10-02 DIAGNOSIS — O26899 Other specified pregnancy related conditions, unspecified trimester: Secondary | ICD-10-CM

## 2016-10-02 DIAGNOSIS — Z79899 Other long term (current) drug therapy: Secondary | ICD-10-CM | POA: Insufficient documentation

## 2016-10-02 LAB — POCT PREGNANCY, URINE: PREG TEST UR: POSITIVE — AB

## 2016-10-02 LAB — CBC
HCT: 35.2 % — ABNORMAL LOW (ref 36.0–46.0)
HEMOGLOBIN: 12.2 g/dL (ref 12.0–15.0)
MCH: 27.9 pg (ref 26.0–34.0)
MCHC: 34.7 g/dL (ref 30.0–36.0)
MCV: 80.5 fL (ref 78.0–100.0)
Platelets: 241 10*3/uL (ref 150–400)
RBC: 4.37 MIL/uL (ref 3.87–5.11)
RDW: 13.7 % (ref 11.5–15.5)
WBC: 7.4 10*3/uL (ref 4.0–10.5)

## 2016-10-02 LAB — WET PREP, GENITAL
Clue Cells Wet Prep HPF POC: NONE SEEN
SPERM: NONE SEEN
Trich, Wet Prep: NONE SEEN
Yeast Wet Prep HPF POC: NONE SEEN

## 2016-10-02 LAB — URINALYSIS, ROUTINE W REFLEX MICROSCOPIC
Bilirubin Urine: NEGATIVE
GLUCOSE, UA: NEGATIVE mg/dL
Hgb urine dipstick: NEGATIVE
KETONES UR: NEGATIVE mg/dL
Nitrite: NEGATIVE
PH: 7 (ref 5.0–8.0)
Protein, ur: NEGATIVE mg/dL
Specific Gravity, Urine: 1.016 (ref 1.005–1.030)

## 2016-10-02 LAB — HCG, QUANTITATIVE, PREGNANCY: hCG, Beta Chain, Quant, S: 43728 m[IU]/mL — ABNORMAL HIGH (ref <5)

## 2016-10-02 NOTE — MAU Provider Note (Signed)
History     CSN: [id number]  Arrival date and time: 10/02/16 [id number]   First Provider Initiated Contact with Patient 10/02/16 1036      Chief Complaint  Patient presents with  . Abdominal Pain   HPI  Jacqueline Walter is a 36 y.o. [id number] at [redacted]w[redacted]d by LMP who presents with abdominal pain. Symptoms began Saturday. Reports intermittent lower abdominal cramping. Currently rates pain 5/10. Has not treated. Denies n/v/d, constipation, vaginal bleeding, vaginal discharge, dysuria, or fever. Last BM was yesterday.   Spanish interpreter at bedside.   OB History    Gravida Para Term Preterm AB Living   4 1 1   2 1    SAB TAB Ectopic Multiple Live Births   2       1      Past Medical History:  Diagnosis Date  . Breast mass, left     Past Surgical History:  Procedure Laterality Date  . MASS EXCISION Left 03/19/2013   Procedure: excision of extraneous breast/fat tissue left axilla;  Surgeon: Haywood Lasso, MD;  Location: [facility];  Service: General;  Laterality: Left;    Family History  Problem Relation Age of Onset  . Colon cancer Maternal Grandmother   . Cancer Maternal Grandmother     unknown  . Heart disease Neg Hx   . Diabetes Neg Hx     Social History  Substance Use Topics  . Smoking status: Never Smoker  . Smokeless tobacco: Never Used  . Alcohol use No    Allergies: No Known Allergies  Prescriptions Prior to Admission  Medication Sig Dispense Refill Last Dose  . amoxicillin (AMOXIL) 875 MG tablet Take 1 tablet (875 mg total) by mouth 2 (two) times daily. (Patient not taking: Reported on 08/07/2016) 20 tablet 0 Not Taking  . chlorhexidine (PERIDEX) 0.12 % solution Use as directed 15 mLs in the mouth or throat 2 (two) times daily. (Patient not taking: Reported on 08/07/2016) 120 mL 0 Not Taking  . ibuprofen (ADVIL,MOTRIN) 800 MG tablet Take 1 tablet (800 mg total) by mouth every 8 (eight) hours as needed. (Patient not taking: Reported on 08/07/2016)  30 tablet 0 Not Taking  . ranitidine (ZANTAC) 150 MG tablet Take 1 tablet (150 mg total) by mouth 2 (two) times daily. 60 tablet 1     Review of Systems  Constitutional: Negative for chills and fever.  Gastrointestinal: Positive for abdominal pain. Negative for constipation, diarrhea, nausea and vomiting.  Genitourinary: Negative for dysuria, vaginal bleeding and vaginal discharge.   Physical Exam   Blood pressure 116/72, pulse 89, temperature 98.9 F (37.2 C), temperature source Oral, resp. rate 16, weight 132 lb 8 oz (60.1 kg), last menstrual period 08/02/2016, SpO2 100 %.  Physical Exam  Nursing note and vitals reviewed. Constitutional: She is oriented to person, place, and time. She appears well-developed and well-nourished. No distress.  HENT:  Head: Normocephalic and atraumatic.  Eyes: Conjunctivae are normal. Right eye exhibits no discharge. Left eye exhibits no discharge. No scleral icterus.  Neck: Normal range of motion.  Cardiovascular: Normal rate, regular rhythm and normal heart sounds.   No murmur heard. Respiratory: Effort normal and breath sounds normal. No respiratory distress. She has no wheezes.  GI: Soft. Bowel sounds are normal. She exhibits no distension. There is no tenderness. There is no rebound and no guarding.  Genitourinary: Uterus normal. Cervix exhibits no motion tenderness and no friability. Right adnexum displays no mass and no tenderness. Left adnexum  displays no mass and no tenderness. No bleeding in the vagina. Vaginal discharge (small amount of thin white discharge ) found.  Genitourinary Comments: Cervix closed  Neurological: She is alert and oriented to person, place, and time.  Skin: Skin is warm and dry. She is not diaphoretic.  Psychiatric: She has a normal mood and affect. Her behavior is normal. Judgment and thought content normal.    MAU Course  Procedures Results for orders placed or performed during the hospital encounter of 10/02/16  (from the past 24 hour(s))  Urinalysis, Routine w reflex microscopic     Status: Abnormal   Collection Time: 10/02/16  9:55 AM  Result Value Ref Range   Color, Urine YELLOW YELLOW   APPearance CLOUDY (A) CLEAR   Specific Gravity, Urine 1.016 1.005 - 1.030   pH 7.0 5.0 - 8.0   Glucose, UA NEGATIVE NEGATIVE mg/dL   Hgb urine dipstick NEGATIVE NEGATIVE   Bilirubin Urine NEGATIVE NEGATIVE   Ketones, ur NEGATIVE NEGATIVE mg/dL   Protein, ur NEGATIVE NEGATIVE mg/dL   Nitrite NEGATIVE NEGATIVE   Leukocytes, UA LARGE (A) NEGATIVE   RBC / HPF 0-5 0 - 5 RBC/hpf   WBC, UA TOO NUMEROUS TO COUNT 0 - 5 WBC/hpf   Bacteria, UA FEW (A) NONE SEEN   Squamous Epithelial / LPF 6-30 (A) NONE SEEN   Mucous PRESENT   Wet prep, genital     Status: Abnormal   Collection Time: 10/02/16 10:41 AM  Result Value Ref Range   Yeast Wet Prep HPF POC NONE SEEN NONE SEEN   Trich, Wet Prep NONE SEEN NONE SEEN   Clue Cells Wet Prep HPF POC NONE SEEN NONE SEEN   WBC, Wet Prep HPF POC MODERATE (A) NONE SEEN   Sperm NONE SEEN   CBC     Status: Abnormal   Collection Time: 10/02/16 10:44 AM  Result Value Ref Range   WBC 7.4 4.0 - 10.5 K/uL   RBC 4.37 3.87 - 5.11 MIL/uL   Hemoglobin 12.2 12.0 - 15.0 g/dL   HCT 35.2 (L) 36.0 - 46.0 %   MCV 80.5 78.0 - 100.0 fL   MCH 27.9 26.0 - 34.0 pg   MCHC 34.7 30.0 - 36.0 g/dL   RDW 13.7 11.5 - 15.5 %   Platelets 241 150 - 400 K/uL  Pregnancy, urine POC     Status: Abnormal   Collection Time: 10/02/16 11:10 AM  Result Value Ref Range   Preg Test, Ur POSITIVE (A) NEGATIVE   US Ob Comp Less 14 Wks  Result Date: 10/02/2016 CLINICAL DATA:  Pregnant, lower abdominal pain/ cramping, history of spontaneous abortions x2 EXAM: OBSTETRIC <14 WK [place] AND TRANSVAGINAL OB US TECHNIQUE: Both transabdominal and transvaginal ultrasound examinations were performed for complete evaluation of the gestation as well as the maternal uterus, adnexal regions, and pelvic cul-de-sac. Transvaginal  technique was performed to assess early pregnancy. COMPARISON:  None. FINDINGS: Intrauterine gestational sac: Single Yolk sac:  Visualized. Embryo:  Visualized. Cardiac Activity: Visualized. Heart Rate: 121  bpm CRL:  3.3  mm   5 w   6 d                  [place] EDC: 05/29/2017 Subchorionic hemorrhage:  Small subchronic hemorrhage. Maternal uterus/adnexae: Bilateral ovaries are within normal limits. No free fluid. IMPRESSION: Single live intrauterine gestation, with estimated gestational age [redacted] weeks 6 days by crown-rump length. Electronically Signed   By: Julian Hy M.D.   On:  10/02/2016 11:32   US Ob Transvaginal  Result Date: 10/02/2016 CLINICAL DATA:  Pregnant, lower abdominal pain/ cramping, history of spontaneous abortions x2 EXAM: OBSTETRIC <14 WK [place] AND TRANSVAGINAL OB US TECHNIQUE: Both transabdominal and transvaginal ultrasound examinations were performed for complete evaluation of the gestation as well as the maternal uterus, adnexal regions, and pelvic cul-de-sac. Transvaginal technique was performed to assess early pregnancy. COMPARISON:  None. FINDINGS: Intrauterine gestational sac: Single Yolk sac:  Visualized. Embryo:  Visualized. Cardiac Activity: Visualized. Heart Rate: 121  bpm CRL:  3.3  mm   5 w   6 d                  [place] EDC: 05/29/2017 Subchorionic hemorrhage:  Small subchronic hemorrhage. Maternal uterus/adnexae: Bilateral ovaries are within normal limits. No free fluid. IMPRESSION: Single live intrauterine gestation, with estimated gestational age [redacted] weeks 6 days by crown-rump length. Electronically Signed   By: Julian Hy M.D.   On: 10/02/2016 11:32    MDM +UPT UA, wet prep, GC/chlamydia, CBC, ABO/Rh, quant hCG, HIV, and [place] today to rule out ectopic pregnancy O positive Ultrasound shows SIUP measuring [redacted]w[redacted]d with cardiac activity & small [place] Assessment and Plan  A: 1. Normal IUP (intrauterine pregnancy) on prenatal ultrasound, first trimester   2. Abdominal pain affecting  pregnancy   3. Subchorionic hematoma in first trimester, single or unspecified fetus    P: Discharge home OB/gyn provider list & pregnancy verification given Start prenatal care Discussed reasons to return to [facility] 10/02/2016, 10:36 AM

## 2016-10-02 NOTE — MAU Note (Signed)
Having pain in lower abd. Started on Friday, cramping.  Took a HPT yesterday, ?+. Cycles are irreg.  Is scared if preg, has had 2 prior miscarriages

## 2016-10-02 NOTE — Discharge Instructions (Signed)
Primer trimestre de embarazo °(First Trimester of Pregnancy) °El primer trimestre de embarazo se extiende desde la semana 1 hasta el final de la semana 12 (mes 1 al mes 3). Durante este tiempo, el bebé comenzará a desarrollarse dentro suyo. Entre la semana 6 y la 8, se forman los ojos y el rostro, y los latidos del corazón pueden verse en la ecografía. Al final de las 12 semanas, todos los órganos del bebé están formados. La atención prenatal es toda la asistencia médica que usted recibe antes del nacimiento del bebé. Asegúrese de recibir una buena atención prenatal y de seguir todas las indicaciones del médico. °CUIDADOS EN EL HOGAR °Medicamentos: °· Tome los medicamentos solamente como se lo haya indicado el médico. Algunos medicamentos se pueden tomar durante el embarazo y otros no. °· Tome las vitaminas prenatales como se lo haya indicado el médico. °· Tome el medicamento que la ayuda a defecar (laxante suave) según sea necesario, si el médico lo autoriza. °Dieta °· Ingiera alimentos saludables de manera regular. °· El médico le indicará la cantidad de peso que puede aumentar. °· No coma carne cruda ni quesos sin cocinar. °· Si tiene malestar estomacal (náuseas) o vomita: °? Ingiera 4 o 5 comidas pequeñas por día en lugar de 3 abundantes. °? Intente comer algunas galletitas saladas. °? Beba líquidos entre las comidas, en lugar de hacerlo durante estas. °· Si tiene dificultad para defecar (estreñimiento): °? Consuma alimentos con alto contenido de fibra, como verduras y frutas frescos, y cereales integrales. °? Beba suficiente líquido para mantener el pis (orina) claro o de color amarillo pálido. °Actividad y ejercicios °· Haga ejercicios solamente como se lo haya indicado el médico. Deje de hacer ejercicios si tiene cólicos o dolor en la parte baja del vientre (abdomen) o en la cintura. °· Intente no estar de pie durante mucho tiempo. Mueva las piernas con frecuencia si debe estar de pie en un lugar durante  mucho tiempo. °· Evite levantar pesos excesivos. °· Use zapatos con tacones bajos. Mantenga una buena postura al sentarse y pararse. °· Puede tener relaciones sexuales, a menos que el médico le indique lo contrario. °Alivio del dolor o las molestias °· Use un sostén que le brinde buen soporte si le duelen las mamas. °· Dese baños con agua tibia (baños de asiento) para aliviar el dolor o las molestias a causa de las hemorroides. Use crema antihemorroidal si el médico se lo permite. °· Descanse con las piernas elevadas si tiene calambres o dolor de cintura. °· Use medias de descanso si tiene las venas de las piernas hinchadas y abultadas (venas varicosas). Eleve los pies durante 15 minutos, 3 o 4 veces por día. Limite la cantidad de sal en su dieta. °Cuidados prenatales °· Programe las visitas prenatales para la semana 12 de embarazo. °· Escriba sus preguntas. Llévelas cuando concurra a las visitas prenatales. °· Concurra a todas las visitas prenatales como se lo haya indicado el médico. °Seguridad °· Colóquese el cinturón de seguridad cuando conduzca. °· Haga una lista con los números de teléfono en caso de emergencia, en la cual deben incluirse los números de los familiares, los amigos, el hospital y los departamentos de policía y de bomberos. °Consejos generales °· Pídale al médico que la derive a clases prenatales en su localidad. Debe comenzar a tomar las clases antes de entrar en el mes 6 de embarazo. °· Pida ayuda si necesita asesoramiento o asistencia con la alimentación. El médico puede aconsejarla o indicarle dónde recurrir para recibir ayuda. °· No se dé baños de inmersión en agua caliente, baños   turcos ni saunas.  No se haga duchas vaginales ni use tampones o toallas higinicas perfumadas.  No mantenga las piernas cruzadas durante mucho tiempo.  Evite el contacto con las bandejas sanitarias de los gatos y la tierra que estos animales usan.  No fume, no consuma hierbas ni beba alcohol. No tome  frmacos que el mdico no haya autorizado.  No consuma ningn producto que contenga tabaco, lo que incluye cigarrillos, tabaco de Higher education careers adviser o Psychologist, sport and exercise. Si necesita ayuda para dejar de fumar, consulte al [employer]. Puede recibir asesoramiento u otro tipo de apoyo para dejar de fumar.  Visite al dentista. En su casa, lvese los dientes con un cepillo dental suave. Psese el hilo dental con suavidad. SOLICITE AYUDA SI:  Tiene mareos.  Tiene clicos leves o siente presin en la parte baja del vientre.  Siente un dolor persistente en la zona del vientre.  Sigue teniendo [employer], vomita o las heces son lquidas (diarrea).  Observa una secrecin, con mal olor que proviene de la vagina.  Siente dolor al [employer].  Tiene el rostro, las [place], las piernas o los tobillos ms hinchados (inflamados). SOLICITE AYUDA DE INMEDIATO SI:  Tiene fiebre.  Tiene una prdida de lquido por la vagina.  Tiene sangrado o pequeas prdidas vaginales.  Tiene clicos o dolor muy intensos en el vientre.  Sube o baja de peso rpidamente.  Vomita sangre. Puede ser similar a la borra del caf  Est en contacto con personas que tienen rubola, la quinta enfermedad o varicela.  Siente un dolor de cabeza muy intenso.  Le falta el aire.  Sufre cualquier tipo de traumatismo, por ejemplo, debido a una cada o un accidente automovilstico. Esta informacin no tiene Marine scientist el consejo del mdico. Asegrese de hacerle al mdico cualquier pregunta que tenga. Document Released: 10/13/2008 Document Revised: 08/07/2014 Document Reviewed: 05/27/2013 Elsevier Interactive Patient Education  2017 Elsevier Inc. Hematoma subcorinico (Subchorionic Hematoma) Un hematoma subcorinico es una acumulacin de sangre entre la pared externa de la placenta y la pared interna del la matriz (tero). La placenta es el rgano que conecta el feto a la pared del tero. La placenta realiza la funcin de  alimentacin, respiracin (oxgeno al feto) y el trabajo de eliminacin de desechos (excrecin) del feto. Un hematoma subcorinico es la anormalidad ms frecuente encontrada en una ecografa durante el primer trimestre o principios del segundo trimestre del embarazo. Si ha habido poca o ninguna hemorragia vaginal, generalmente los pequeos hematomas se reducen por su propia cuenta y no afectan al beb ni al [place]. La sangre es absorbida gradualmente durante una o [place]. Cuando la hemorragia comienza ms tarde en el embarazo o el hematoma es ms grande o se produce en una paciente de edad avanzada, el resultado puede no ser tan bueno. Los grandes hematomas pueden agrandarse an ms y [place] las posibilidades de aborto espontneo. El hematoma subcorinico tambin aumenta el riesgo de desprendimiento precoz de la placenta del tero, muerte fetal y Environmental education officer. INSTRUCCIONES PARA EL CUIDADO EN EL HOGAR  Repose en cama si el mdico se lo recomienda. Aunque el reposo en cama no evitar la hemorragia o un aborto espontneo, su mdico puede recomendarlo.  Evite levantar objetos pesados (ms de 10 libras [4,5 kg]), hacer ejercicio, tener relaciones sexuales o realizar duchas vaginales segn se lo indique el profesional.  Lleve un registro de la cantidad y Energy manager de remojo (saturacin) de las toallas higinicas que Medical laboratory scientific officer. [place] esta informacin.  No use  tampones.  Cumpla con todas las visitas de control, segn le indique su mdico. El profesional podr pedirle que se realice anlisis de seguimiento, pruebas de [place] o [place]. SOLICITE ATENCIN MDICA DE INMEDIATO SI:  Siente calambres intensos en el estmago, en la espalda, en el abdomen o en la pelvis.  Tiene fiebre.  Elimina cogulos o tejidos grandes. Guarde los tejidos para que su mdico los vea.  Si la hemorragia aumenta o siente mareos, debilidad o tiene episodios de [place]. Esta informacin no tiene Hydrologist el consejo del mdico. Asegrese de hacerle al mdico cualquier pregunta que tenga. Document Released: 11/02/2008 Document Revised: 05/07/2013 Document Reviewed: 02/13/2013 Elsevier Interactive Patient Education  2017 [employer].

## 2016-10-03 LAB — GC/CHLAMYDIA PROBE AMP (~~LOC~~) NOT AT ARMC
CHLAMYDIA, DNA PROBE: NEGATIVE
Neisseria Gonorrhea: NEGATIVE

## 2016-10-03 LAB — CULTURE, OB URINE: Culture: NO GROWTH

## 2016-10-03 LAB — HIV ANTIBODY (ROUTINE TESTING W REFLEX): HIV Screen 4th Generation wRfx: NONREACTIVE

## 2016-11-14 ENCOUNTER — Encounter: Payer: Self-pay | Admitting: Certified Nurse Midwife

## 2016-11-14 ENCOUNTER — Ambulatory Visit (INDEPENDENT_AMBULATORY_CARE_PROVIDER_SITE_OTHER): Payer: Self-pay | Admitting: Certified Nurse Midwife

## 2016-11-14 VITALS — BP 102/68 | HR 83 | Temp 97.6°F | Wt 131.0 lb

## 2016-11-14 DIAGNOSIS — Z3480 Encounter for supervision of other normal pregnancy, unspecified trimester: Secondary | ICD-10-CM | POA: Insufficient documentation

## 2016-11-14 DIAGNOSIS — Z3481 Encounter for supervision of other normal pregnancy, first trimester: Secondary | ICD-10-CM

## 2016-11-14 DIAGNOSIS — O219 Vomiting of pregnancy, unspecified: Secondary | ICD-10-CM

## 2016-11-14 MED ORDER — PROMETHAZINE HCL 12.5 MG PO TABS: 12.5000 mg | ORAL_TABLET | Freq: Four times a day (QID) | ORAL | 4 refills | Status: DC | PRN

## 2016-11-14 NOTE — Progress Notes (Signed)
Patient  Presents for New OB visit

## 2016-11-14 NOTE — Progress Notes (Signed)
.initialob Subjective:    Jacqueline Walter is being seen today for her first obstetrical visit.  This is not a planned pregnancy but is "happy" to be expecting a baby. She is at 41w0dgestation. Her obstetrical history is significant for abnormal glucose tolerance test, and benign left breast mass excision. Patient reports regular menses with medium to small bleeding that lasts for 3 to 4 days.  Patient has one living child that was born spontaneous vaginal delivery 2011 that she denies any complication with pregnancy or delivery. Patient does have a history of 2 SAB, but reports they both were before 10 wks. Relationship with FOB: significant other, living together  and patient states he will help with the infant,fob not present at this visit. Patient does admit to occasional nausea but denies vomiting..Patient does intend to breast feed.. Pregnancy history fully reviewed.  The information documented in the HPI was reviewed and verified.  Menstrual History: OB History    Gravida Para Term Preterm AB Living   4 1 1   2 1    SAB TAB Ectopic Multiple Live Births   2       1      Menarche age: 89Patient's last menstrual period was 08/02/2016 (approximate).    Past Medical History:  Diagnosis Date  . Breast mass, left     Past Surgical History:  Procedure Laterality Date  . MASS EXCISION Left 03/19/2013   Procedure: excision of extraneous breast/fat tissue left axilla;  Surgeon: CHaywood Lasso MD;  [facility]  Service: General;  Laterality: Left;     (Not in a hospital admission) No Known Allergies  Social History  Substance Use Topics  . Smoking status: Never Smoker  . Smokeless tobacco: Never Used  . Alcohol use No    Family History  Problem Relation Age of Onset  . Colon cancer Maternal Grandmother   . Cancer Maternal Grandmother     unknown  . Heart disease Neg Hx   . Diabetes Neg Hx      Review of Systems Constitutional: negative  for weight loss Gastrointestinal: negative for vomiting Genitourinary:negative for genital lesions and vaginal discharge and dysuria Musculoskeletal:negative for back pain Behavioral/Psych: negative for abusive relationship, depression, illegal drug usage and tobacco use    Objective:    BP 102/68   Pulse 83   Temp 97.6 F (36.4 C)   Wt 131 lb (59.4 kg)   LMP 08/02/2016 (Approximate)   BMI 23.96 kg/m  General Appearance:    Alert, cooperative, no distress, appears stated age  Head:    Normocephalic, without obvious abnormality, atraumatic  Eyes:    PERRL, conjunctiva/corneas clear, EOM's intact, fundi    benign, both eyes  Ears:    Normal TM's and external ear canals, both ears  Nose:   Nares normal, septum midline, mucosa normal, no drainage    or sinus tenderness  Throat:   Lips, mucosa, and tongue normal; teeth and gums normal  Neck:   Supple, symmetrical, trachea midline, no adenopathy;    thyroid:  no enlargement/tenderness/nodules; no carotid   bruit or JVD  Back:     Symmetric, no curvature, ROM normal, no CVA tenderness  Lungs:     Clear to auscultation bilaterally, respirations unlabored  Chest Wall:    No tenderness or deformity   Heart:    Regular rate and rhythm, S1 and S2 normal, no murmur, rub   or gallop  Breast Exam:    No  tenderness, masses, or nipple abnormality  Abdomen:     Soft, non-tender, bowel sounds active all four quadrants,    no masses, no organomegaly  Vagina   Cervical check peformed Cervix-Closed, Thick, High,   Genitalia:    Normal female without lesion, discharge or tenderness  Extremities:   Extremities normal, atraumatic, no cyanosis or edema  Pulses:   2+ and symmetric all extremities  Skin:   Skin color, texture, turgor normal, no rashes or lesions  Lymph nodes:   Cervical, supraclavicular, and axillary nodes normal  Neurologic:   CNII-XII intact, normal strength, sensation and reflexes    throughout      Lab Review Urine pregnancy  test Labs reviewed yes Radiologic studies reviewed yes Assessment:    Pregnancy at 38w0dweeks ,FHT 161,S=D   Plan:    Supervision of other normal pregnancy, antepartum -   Plan: Obstetric Panel, Including HIV, Hemoglobinopathy evaluation, Varicella zoster antibody, IgG, HgB A1c, Comp Met (CMET), CANCELED: Cytology - PAP, CANCELED today-LAST PAP 07/16 @ [facility] -Records Release signed and requested, Comprehensive metabolic panel  Nausea and vomiting during pregnancy prior to [redacted] weeks gestation - Plan: promethazine (PHENERGAN) 12.5 MG tablet, Comp Met (CMET)     Prenatal vitamins.  Counseling provided regarding continued use of seat belts, cessation of alcohol consumption, smoking or use of illicit drugs; infection precautions i.e., influenza/TDAP immunizations, toxoplasmosis,CMV, parvovirus, listeria and varicella; workplace safety, exercise during pregnancy; routine dental care, safe medications, sexual activity, hot tubs, saunas, pools, travel, caffeine use, fish and methlymercury, potential toxins, hair treatments, varicose veins Weight gain recommendations per IOM guidelines reviewed: underweight/BMI< 18.5--> gain 28 - 40 lbs; normal weight/BMI 18.5 - 24.9--> gain 25 - 35 lbs; overweight/BMI 25 - 29.9--> gain 15 - 25 lbs; obese/BMI >30->gain  11 - 20 lbs Problem list reviewed and updated. FIRST/CF mutation testing/NIPT/QUAD SCREEN/fragile X/Ashkenazi Jewish population testing/Spinal muscular atrophy discussed: declined due to cost,patient is in the [employer] program.. Role of ultrasound in pregnancy discussed; fetal survey: requested. Amniocentesis discussed: not indicated. Meds ordered this encounter  Medications  . promethazine (PHENERGAN) 12.5 MG tablet    Sig: Take 1 tablet (12.5 mg total) by mouth every 6 (six) hours as needed for nausea or vomiting.    Dispense:  30 tablet    Refill:  4   Orders Placed This Encounter  Procedures  . Obstetric Panel, Including HIV  .  Hemoglobinopathy evaluation  . Varicella zoster antibody, IgG  . HgB A1c  . Comp Met (CMET)    Follow up in 4 weeks. 50% of 30 min visit spent on counseling and coordination of care.

## 2016-11-15 ENCOUNTER — Other Ambulatory Visit: Payer: Self-pay | Admitting: Certified Nurse Midwife

## 2016-11-15 DIAGNOSIS — O09899 Supervision of other high risk pregnancies, unspecified trimester: Secondary | ICD-10-CM

## 2016-11-15 DIAGNOSIS — Z283 Underimmunization status: Principal | ICD-10-CM

## 2016-11-15 LAB — OBSTETRIC PANEL, INCLUDING HIV
Antibody Screen: NEGATIVE
BASOS: 0 %
Basophils Absolute: 0 10*3/uL (ref 0.0–0.2)
EOS (ABSOLUTE): 0.1 10*3/uL (ref 0.0–0.4)
EOS: 1 %
HEMOGLOBIN: 11.6 g/dL (ref 11.1–15.9)
HIV Screen 4th Generation wRfx: NONREACTIVE
Hematocrit: 35 % (ref 34.0–46.6)
Hepatitis B Surface Ag: NEGATIVE
IMMATURE GRANS (ABS): 0 10*3/uL (ref 0.0–0.1)
IMMATURE GRANULOCYTES: 1 %
LYMPHS: 20 %
Lymphocytes Absolute: 1.7 10*3/uL (ref 0.7–3.1)
MCH: 27.1 pg (ref 26.6–33.0)
MCHC: 33.1 g/dL (ref 31.5–35.7)
MCV: 82 fL (ref 79–97)
Monocytes Absolute: 0.6 10*3/uL (ref 0.1–0.9)
Monocytes: 7 %
NEUTROS PCT: 71 %
Neutrophils Absolute: 6.1 10*3/uL (ref 1.4–7.0)
Platelets: 273 10*3/uL (ref 150–379)
RBC: 4.28 x10E6/uL (ref 3.77–5.28)
RDW: 15.5 % — ABNORMAL HIGH (ref 12.3–15.4)
RH TYPE: POSITIVE
RPR: NONREACTIVE
RUBELLA: 14.9 {index} (ref >0.99)
WBC: 8.5 10*3/uL (ref 3.4–10.8)

## 2016-11-15 LAB — HEMOGLOBINOPATHY EVALUATION
HEMOGLOBIN A2 QUANTITATION: 3.2 % (ref 1.8–3.2)
HGB C: 0 %
HGB S: 0 %
HGB VARIANT: 0 %
Hemoglobin F Quantitation: 0 % (ref 0.0–2.0)
Hgb A: 96.8 % (ref 96.4–98.8)

## 2016-11-15 LAB — COMPREHENSIVE METABOLIC PANEL
ALT: 42 IU/L — ABNORMAL HIGH (ref 0–32)
AST: 32 IU/L (ref 0–40)
Albumin/Globulin Ratio: 1.5 (ref 1.2–2.2)
Albumin: 4 g/dL (ref 3.5–5.5)
Alkaline Phosphatase: 58 IU/L (ref 39–117)
BILIRUBIN TOTAL: 0.5 mg/dL (ref 0.0–1.2)
BUN / CREAT RATIO: 14 (ref 9–23)
BUN: 6 mg/dL (ref 6–20)
CHLORIDE: 100 mmol/L (ref 96–106)
CO2: 20 mmol/L (ref 18–29)
Calcium: 8.7 mg/dL (ref 8.7–10.2)
Creatinine, Ser: 0.44 mg/dL — ABNORMAL LOW (ref 0.57–1.00)
GFR calc non Af Amer: 132 mL/min/{1.73_m2} (ref >59)
GFR, EST AFRICAN AMERICAN: 152 mL/min/{1.73_m2} (ref >59)
GLOBULIN, TOTAL: 2.7 g/dL (ref 1.5–4.5)
GLUCOSE: 72 mg/dL (ref 65–99)
Potassium: 3.9 mmol/L (ref 3.5–5.2)
Sodium: 137 mmol/L (ref 134–144)
TOTAL PROTEIN: 6.7 g/dL (ref 6.0–8.5)

## 2016-11-15 LAB — HEMOGLOBIN A1C
Est. average glucose Bld gHb Est-mCnc: 108 mg/dL
Hgb A1c MFr Bld: 5.4 % (ref 4.8–5.6)

## 2016-11-15 LAB — VARICELLA ZOSTER ANTIBODY, IGG: Varicella zoster IgG: <135 index — ABNORMAL LOW (ref >165)

## 2016-12-12 ENCOUNTER — Encounter: Payer: Self-pay | Admitting: Certified Nurse Midwife

## 2016-12-12 ENCOUNTER — Ambulatory Visit (INDEPENDENT_AMBULATORY_CARE_PROVIDER_SITE_OTHER): Payer: Self-pay | Admitting: Certified Nurse Midwife

## 2016-12-12 VITALS — BP 111/73 | HR 94 | Wt 134.9 lb

## 2016-12-12 DIAGNOSIS — Z3480 Encounter for supervision of other normal pregnancy, unspecified trimester: Secondary | ICD-10-CM

## 2016-12-12 DIAGNOSIS — O09899 Supervision of other high risk pregnancies, unspecified trimester: Secondary | ICD-10-CM

## 2016-12-12 DIAGNOSIS — O09529 Supervision of elderly multigravida, unspecified trimester: Secondary | ICD-10-CM | POA: Insufficient documentation

## 2016-12-12 DIAGNOSIS — Z283 Underimmunization status: Secondary | ICD-10-CM

## 2016-12-12 DIAGNOSIS — O09522 Supervision of elderly multigravida, second trimester: Secondary | ICD-10-CM

## 2016-12-12 DIAGNOSIS — O09892 Supervision of other high risk pregnancies, second trimester: Secondary | ICD-10-CM

## 2016-12-12 NOTE — Progress Notes (Signed)
   PRENATAL VISIT NOTE  Subjective:  Jacqueline Walter is a 36 y.o. [id number] at [redacted]w[redacted]d being seen today for ongoing prenatal care.  She is currently monitored for the following issues for this low-risk pregnancy and has Benign neoplasm of breast; Supervision of other normal pregnancy, antepartum; Maternal varicella, non-immune; and AMA (advanced maternal age) multigravida 35+ on her problem list.  Patient reports no bleeding, no contractions, no cramping, no leaking and round ligament pain.  Contractions: Not present. Vag. Bleeding: None.  Movement: Present. Denies leaking of fluid.   The following portions of the patient's history were reviewed and updated as appropriate: allergies, current medications, past family history, past medical history, past social history, past surgical history and problem list. Problem list updated.  Objective:   Vitals:   12/12/16 1004  BP: 111/73  Pulse: 94  Weight: 134 lb 14.4 oz (61.2 kg)    Fetal Status: Fetal Heart Rate (bpm): 158   Movement: Present     General:  Alert, oriented and cooperative. Patient is in no acute distress.  Skin: Skin is warm and dry. No rash noted.   Cardiovascular: Normal heart rate noted  Respiratory: Normal respiratory effort, no problems with respiration noted  Abdomen: Soft, gravid, appropriate for gestational age. Pain/Pressure: Present     Pelvic:  Cervical exam deferred        Extremities: Normal range of motion.  Edema: None  Mental Status: Normal mood and affect. Normal behavior. Normal judgment and thought content.   Assessment and Plan:  Pregnancy: G4P1021 at [redacted]w[redacted]d  1. Supervision of other normal pregnancy, antepartum      Doing well.  Round ligament pain of pregnancy, homeopathic comfort measures discussed.     Desires blood work today, discussed: patient contacted [facility].  - AFP, Serum, Open Spina Bifida - MaterniT21 PLUS Core+SCA  2. Maternal varicella, non-immune     Varicella postpartum  3.  Antepartum multigravida of advanced maternal age     Will be 36 years of age at delivery  Preterm labor symptoms and general obstetric precautions including but not limited to vaginal bleeding, contractions, leaking of fluid and fetal movement were reviewed in detail with the patient. Please refer to After Visit Summary for other counseling recommendations.  Return in about 4 weeks (around 01/09/2017) for ROB.   Morene Crocker, CNM

## 2016-12-12 NOTE — Progress Notes (Signed)
ROB c/o intermittent round ligament pains.

## 2016-12-15 LAB — AFP, SERUM, OPEN SPINA BIFIDA
AFP MoM: 1.02
AFP VALUE AFPOSL: 35.8 ng/mL
Gest. Age on Collection Date: 16 weeks
MATERNAL AGE AT EDD: 35.2 a
OSBR RISK 1 IN: 10000
TEST RESULTS AFP: NEGATIVE
WEIGHT: 135 [lb_av]

## 2016-12-18 ENCOUNTER — Other Ambulatory Visit: Payer: Self-pay | Admitting: Certified Nurse Midwife

## 2016-12-18 DIAGNOSIS — Z3480 Encounter for supervision of other normal pregnancy, unspecified trimester: Secondary | ICD-10-CM

## 2016-12-18 LAB — MATERNIT21 PLUS CORE+SCA
CHROMOSOME 13: NEGATIVE
CHROMOSOME 18: NEGATIVE
CHROMOSOME 21: NEGATIVE
Y CHROMOSOME: NOT DETECTED

## 2017-01-08 ENCOUNTER — Encounter: Payer: Self-pay | Admitting: *Deleted

## 2017-01-09 ENCOUNTER — Ambulatory Visit (INDEPENDENT_AMBULATORY_CARE_PROVIDER_SITE_OTHER): Payer: Self-pay | Admitting: Certified Nurse Midwife

## 2017-01-09 ENCOUNTER — Encounter: Payer: Self-pay | Admitting: Certified Nurse Midwife

## 2017-01-09 VITALS — BP 98/62 | HR 86 | Wt 138.0 lb

## 2017-01-09 DIAGNOSIS — Z283 Underimmunization status: Secondary | ICD-10-CM

## 2017-01-09 DIAGNOSIS — N760 Acute vaginitis: Secondary | ICD-10-CM

## 2017-01-09 DIAGNOSIS — O09522 Supervision of elderly multigravida, second trimester: Secondary | ICD-10-CM

## 2017-01-09 DIAGNOSIS — O09892 Supervision of other high risk pregnancies, second trimester: Secondary | ICD-10-CM

## 2017-01-09 DIAGNOSIS — O23592 Infection of other part of genital tract in pregnancy, second trimester: Secondary | ICD-10-CM

## 2017-01-09 DIAGNOSIS — O09899 Supervision of other high risk pregnancies, unspecified trimester: Secondary | ICD-10-CM

## 2017-01-09 DIAGNOSIS — Z3480 Encounter for supervision of other normal pregnancy, unspecified trimester: Secondary | ICD-10-CM

## 2017-01-09 MED ORDER — FLUCONAZOLE 150 MG PO TABS: 150.0000 mg | ORAL_TABLET | Freq: Once | ORAL | 0 refills | Status: AC

## 2017-01-09 NOTE — Progress Notes (Signed)
   PRENATAL VISIT NOTE  Subjective:  Jacqueline Walter is a 36 y.o. [id number] at [redacted]w[redacted]d being seen today for ongoing prenatal care.  She is currently monitored for the following issues for this high-risk pregnancy and has Benign neoplasm of breast; Supervision of other normal pregnancy, antepartum; Maternal varicella, non-immune; and AMA (advanced maternal age) multigravida 35+ on her problem list.  Patient reports no bleeding, no contractions, no cramping, no leaking and vaginal irritation.  Contractions: Not present. Vag. Bleeding: None.  Movement: Present. Denies leaking of fluid.   The following portions of the patient's history were reviewed and updated as appropriate: allergies, current medications, past family history, past medical history, past social history, past surgical history and problem list. Problem list updated.  Objective:   Vitals:   01/09/17 0955  BP: 98/62  Pulse: 86  Weight: 138 lb (62.6 kg)    Fetal Status: Fetal Heart Rate (bpm): 152 Fundal Height: 20 cm Movement: Present     General:  Alert, oriented and cooperative. Patient is in no acute distress.  Skin: Skin is warm and dry. No rash noted.   Cardiovascular: Normal heart rate noted  Respiratory: Normal respiratory effort, no problems with respiration noted  Abdomen: Soft, gravid, appropriate for gestational age. Pain/Pressure: Present     Pelvic:  Cervical exam deferred        Extremities: Normal range of motion.  Edema: None  Mental Status: Normal mood and affect. Normal behavior. Normal judgment and thought content.   Assessment and Plan:  Pregnancy: G4P1021 at [redacted]w[redacted]d  1. Supervision of other normal pregnancy, antepartum      Doing well.  [place] results reviewed.  Dating by 5 week [place]. On anatomy scan fetus <3%, f/u scan ordered for 6 weeks.   2. Maternal varicella, non-immune     Varicella postpartum  3. Elderly multigravida in second trimester     Will be >35 at delivery  4. Vaginitis affecting  pregnancy, current, antepartum, second trimester      OTC monistat - Cervicovaginal ancillary only - fluconazole (DIFLUCAN) 150 MG tablet; Take 1 tablet (150 mg total) by mouth once.  Dispense: 1 tablet; Refill: 0  Preterm labor symptoms and general obstetric precautions including but not limited to vaginal bleeding, contractions, leaking of fluid and fetal movement were reviewed in detail with the patient. Please refer to After Visit Summary for other counseling recommendations.  Return in about 4 weeks (around 02/06/2017) for ROB.   Morene Crocker, CNM

## 2017-01-09 NOTE — Progress Notes (Signed)
Patient complains of itching and odor

## 2017-01-10 LAB — CERVICOVAGINAL ANCILLARY ONLY: CANDIDA VAGINITIS: NEGATIVE

## 2017-02-06 ENCOUNTER — Ambulatory Visit (INDEPENDENT_AMBULATORY_CARE_PROVIDER_SITE_OTHER): Payer: Self-pay | Admitting: Certified Nurse Midwife

## 2017-02-06 VITALS — BP 98/61 | HR 96 | Wt 142.0 lb

## 2017-02-06 DIAGNOSIS — O09522 Supervision of elderly multigravida, second trimester: Secondary | ICD-10-CM

## 2017-02-06 DIAGNOSIS — O09899 Supervision of other high risk pregnancies, unspecified trimester: Secondary | ICD-10-CM

## 2017-02-06 DIAGNOSIS — Z283 Underimmunization status: Secondary | ICD-10-CM

## 2017-02-06 DIAGNOSIS — Z3480 Encounter for supervision of other normal pregnancy, unspecified trimester: Secondary | ICD-10-CM

## 2017-02-06 DIAGNOSIS — O09892 Supervision of other high risk pregnancies, second trimester: Secondary | ICD-10-CM

## 2017-02-06 DIAGNOSIS — Z3482 Encounter for supervision of other normal pregnancy, second trimester: Secondary | ICD-10-CM

## 2017-02-06 NOTE — Progress Notes (Signed)
   PRENATAL VISIT NOTE  Subjective:  Jacqueline Walter is a 36 y.o. [id number] at [redacted]w[redacted]d being seen today for ongoing prenatal care.  She is currently monitored for the following issues for this low-risk pregnancy and has Benign neoplasm of breast; Supervision of other normal pregnancy, antepartum; Maternal varicella, non-immune; and AMA (advanced maternal age) multigravida 35+ on her problem list.  Patient reports no complaints.  Contractions: Not present. Vag. Bleeding: None.  Movement: Present. Denies leaking of fluid.   The following portions of the patient's history were reviewed and updated as appropriate: allergies, current medications, past family history, past medical history, past social history, past surgical history and problem list. Problem list updated.  Objective:   Vitals:   02/06/17 1039  BP: 98/61  Pulse: 96  Weight: 142 lb (64.4 kg)    Fetal Status: Fetal Heart Rate (bpm): 155 Fundal Height: 24 cm Movement: Present     General:  Alert, oriented and cooperative. Patient is in no acute distress.  Skin: Skin is warm and dry. No rash noted.   Cardiovascular: Normal heart rate noted  Respiratory: Normal respiratory effort, no problems with respiration noted  Abdomen: Soft, gravid, appropriate for gestational age. Pain/Pressure: Present     Pelvic:  Cervical exam deferred        Extremities: Normal range of motion.     Mental Status: Normal mood and affect. Normal behavior. Normal judgment and thought content.   Assessment and Plan:  Pregnancy: G4P1021 at [redacted]w[redacted]d  1. Supervision of other normal pregnancy, antepartum     Has f/u anatomy scan scheduled for later this month for size <3% on anatomy scan done 01/04/17.    2. Maternal varicella, non-immune     Varicella postpartum @[facility] .   3. Elderly multigravida in second trimester      <40 years.   Preterm labor symptoms and general obstetric precautions including but not limited to vaginal bleeding, contractions,  leaking of fluid and fetal movement were reviewed in detail with the patient. Please refer to After Visit Summary for other counseling recommendations.  Return in about 4 weeks (around 03/06/2017) for ROB.   Morene Crocker, CNM

## 2017-03-06 ENCOUNTER — Ambulatory Visit (INDEPENDENT_AMBULATORY_CARE_PROVIDER_SITE_OTHER): Payer: Self-pay | Admitting: Certified Nurse Midwife

## 2017-03-06 VITALS — BP 115/72 | HR 108 | Wt 147.4 lb

## 2017-03-06 DIAGNOSIS — O09522 Supervision of elderly multigravida, second trimester: Secondary | ICD-10-CM

## 2017-03-06 DIAGNOSIS — O09899 Supervision of other high risk pregnancies, unspecified trimester: Secondary | ICD-10-CM

## 2017-03-06 DIAGNOSIS — O09893 Supervision of other high risk pregnancies, third trimester: Secondary | ICD-10-CM

## 2017-03-06 DIAGNOSIS — Z2839 Other underimmunization status: Secondary | ICD-10-CM

## 2017-03-06 DIAGNOSIS — Z3480 Encounter for supervision of other normal pregnancy, unspecified trimester: Secondary | ICD-10-CM

## 2017-03-06 DIAGNOSIS — O09523 Supervision of elderly multigravida, third trimester: Secondary | ICD-10-CM

## 2017-03-06 DIAGNOSIS — Z283 Underimmunization status: Secondary | ICD-10-CM

## 2017-03-06 NOTE — Progress Notes (Signed)
Patient reports she is doing well today.

## 2017-03-06 NOTE — Progress Notes (Signed)
   PRENATAL VISIT NOTE  Subjective:  Jacqueline Walter is a 36 y.o. [id number] at [redacted]w[redacted]d being seen today for ongoing prenatal care.  She is currently monitored for the following issues for this low-risk pregnancy and has Benign neoplasm of breast; Supervision of other normal pregnancy, antepartum; Maternal varicella, non-immune; and AMA (advanced maternal age) multigravida 35+ on her problem list.  Patient reports no complaints.  Contractions: Not present. Vag. Bleeding: None.  Movement: Present. Denies leaking of fluid.   The following portions of the patient's history were reviewed and updated as appropriate: allergies, current medications, past family history, past medical history, past social history, past surgical history and problem list. Problem list updated.  Objective:   Vitals:   03/06/17 1036  BP: 115/72  Pulse: (!) 108  Weight: 147 lb 6.4 oz (66.9 kg)    Fetal Status: Fetal Heart Rate (bpm): 158 Fundal Height: 29 cm Movement: Present     General:  Alert, oriented and cooperative. Patient is in no acute distress.  Skin: Skin is warm and dry. No rash noted.   Cardiovascular: Normal heart rate noted  Respiratory: Normal respiratory effort, no problems with respiration noted  Abdomen: Soft, gravid, appropriate for gestational age.  Pain/Pressure: Absent     Pelvic: Cervical exam deferred        Extremities: Normal range of motion.  Edema: None  Mental Status:  Normal mood and affect. Normal behavior. Normal judgment and thought content.   Assessment and Plan:  Pregnancy: G4P1021 at [redacted]w[redacted]d  1. Supervision of other normal pregnancy, antepartum      [place] from [place] reviewed.  Growth 17%, f/u growth ordered.    2. Elderly multigravida in second trimester     <46 years of age  89. Maternal varicella, non-immune      Varicella postpartum  Preterm labor symptoms and general obstetric precautions including but not limited to vaginal bleeding, contractions, leaking of  fluid and fetal movement were reviewed in detail with the patient. Please refer to After Visit Summary for other counseling recommendations.  Return in about 2 weeks (around 03/20/2017) for ROB, 2 hr OGTT Next week.   Morene Crocker, CNM

## 2017-03-06 NOTE — Patient Instructions (Signed)
AREA PEDIATRIC/FAMILY PRACTICE PHYSICIANS  [facility] [address], [address] [place], [place]  [address] Phone - [phone]   Fax - [phone]  [facility] [address] [place], [place] [address] Phone - [phone]   Fax - [phone]  [facility] AM[facility] [address] [place], [place]  [address] Phone - [phone]   Fax - [phone]  [facility] [address] [place], [place]  [address] Phone - [phone]   Fax - [phone]  [facility] [address] [place], [place]  [address] Phone - [phone]   Fax - [phone]  [facility] [address] [place], [place]  [address] Phone - [phone]   Fax - [phone]  [facility] [address] [place], [place]  [address] Phone - [phone]   Fax - [phone]  [facility] [address] [place], [place]  [address] Phone - [phone]   Fax - [phone]  [facility] [address] [place], [place]  [address] Phone - [phone]   Fax - [phone] [facility] [address] [place], [place]  [address] Phone - [phone]   Fax - [phone]  [facility] [address] [place], [place]  [address] Phone - [phone]   Fax - [phone]  [facility] [address] [place], [place]  [address] Phone - [phone]   Fax - [phone]  [facility] [address], [address] [place], [place]  [address] Phone - [phone]   Fax - [phone]  SHILPA GOSRANI [address] [place], [place]  [address] Phone - [phone]  [facility] [address] [place], [place]  [address] Phone - [phone]   Fax - [phone]  [facility] [address] [place], [place]  [address] Phone - [phone]   Fax - [phone]  [facility] [address] [place], [place]  [address] Phone - [phone]   Fax - [phone]  [facility] [address] [place], [place]  [address] Phone - [phone]   Fax - [phone]   [facility] [address] [place], [place]  [address] Phone - [phone]   Fax - [phone]  [facility] [address] [place], [place]  [address] Phone - [phone]   Fax - [phone]  DAVID RUBIN [address] [place], [place]  [address] Phone - [phone]   Fax - [phone]  [facility] [address] [place], [place]  [address] Phone - [phone]   Fax - [phone]  [facility] [address] , [place]  [address] Phone - [phone]   Fax - [phone] [facility] [address] [place], [place]  [address] Phone - [phone]   Fax - [phone]  [facility] [address] [place], [place]  [address] Phone - [phone]   Fax - [phone]  [facility] [address] [place], [place]  [address] Phone - [phone]   Fax - [phone]  [facility] Charlene Flemming MD [address] [place] [address] Phone [phone]  Fax [phone]   

## 2017-03-13 ENCOUNTER — Other Ambulatory Visit: Payer: Self-pay

## 2017-03-13 DIAGNOSIS — Z3493 Encounter for supervision of normal pregnancy, unspecified, third trimester: Secondary | ICD-10-CM

## 2017-03-14 LAB — CBC
HEMATOCRIT: 34.4 % (ref 34.0–46.6)
Hemoglobin: 11.1 g/dL (ref 11.1–15.9)
MCH: 28 pg (ref 26.6–33.0)
MCHC: 32.3 g/dL (ref 31.5–35.7)
MCV: 87 fL (ref 79–97)
Platelets: 263 10*3/uL (ref 150–379)
RBC: 3.97 x10E6/uL (ref 3.77–5.28)
RDW: 14 % (ref 12.3–15.4)
WBC: 8.9 10*3/uL (ref 3.4–10.8)

## 2017-03-14 LAB — GLUCOSE TOLERANCE, 2 HOURS W/ 1HR
GLUCOSE, 1 HOUR: 166 mg/dL (ref 65–179)
GLUCOSE, 2 HOUR: 134 mg/dL (ref 65–152)
Glucose, Fasting: 78 mg/dL (ref 65–91)

## 2017-03-14 LAB — RPR: RPR: NONREACTIVE

## 2017-03-14 LAB — HIV ANTIBODY (ROUTINE TESTING W REFLEX): HIV Screen 4th Generation wRfx: NONREACTIVE

## 2017-03-15 ENCOUNTER — Other Ambulatory Visit: Payer: Self-pay | Admitting: Certified Nurse Midwife

## 2017-03-15 DIAGNOSIS — Z3480 Encounter for supervision of other normal pregnancy, unspecified trimester: Secondary | ICD-10-CM

## 2017-03-20 ENCOUNTER — Encounter: Payer: Self-pay | Admitting: Certified Nurse Midwife

## 2017-04-03 ENCOUNTER — Encounter: Payer: Self-pay | Admitting: Certified Nurse Midwife

## 2017-04-06 ENCOUNTER — Encounter: Payer: Self-pay | Admitting: Certified Nurse Midwife

## 2017-04-06 ENCOUNTER — Ambulatory Visit (INDEPENDENT_AMBULATORY_CARE_PROVIDER_SITE_OTHER): Payer: Self-pay | Admitting: Certified Nurse Midwife

## 2017-04-06 VITALS — BP 96/64 | HR 96 | Wt 151.1 lb

## 2017-04-06 DIAGNOSIS — O09523 Supervision of elderly multigravida, third trimester: Secondary | ICD-10-CM

## 2017-04-06 DIAGNOSIS — Z2839 Other underimmunization status: Secondary | ICD-10-CM

## 2017-04-06 DIAGNOSIS — O09899 Supervision of other high risk pregnancies, unspecified trimester: Secondary | ICD-10-CM

## 2017-04-06 DIAGNOSIS — O09893 Supervision of other high risk pregnancies, third trimester: Secondary | ICD-10-CM

## 2017-04-06 DIAGNOSIS — O09522 Supervision of elderly multigravida, second trimester: Secondary | ICD-10-CM

## 2017-04-06 DIAGNOSIS — Z283 Underimmunization status: Secondary | ICD-10-CM

## 2017-04-06 DIAGNOSIS — Z3480 Encounter for supervision of other normal pregnancy, unspecified trimester: Secondary | ICD-10-CM

## 2017-04-06 NOTE — Progress Notes (Signed)
   PRENATAL VISIT NOTE  Subjective:  Jacqueline Walter is a 36 y.o. [id number] at [redacted]w[redacted]d being seen today for ongoing prenatal care.  She is currently monitored for the following issues for this low-risk pregnancy and has Benign neoplasm of breast; Supervision of other normal pregnancy, antepartum; Maternal varicella, non-immune; and AMA (advanced maternal age) multigravida 35+ on her problem list.  Patient reports no complaints.  Contractions: Not present. Vag. Bleeding: None.  Movement: Present. Denies leaking of fluid.   The following portions of the patient's history were reviewed and updated as appropriate: allergies, current medications, past family history, past medical history, past social history, past surgical history and problem list. Problem list updated.  Objective:   Vitals:   04/06/17 1001  BP: 96/64  Pulse: 96  Weight: 151 lb 1.6 oz (68.5 kg)    Fetal Status: Fetal Heart Rate (bpm): 156; doppler Fundal Height: 31 cm Movement: Present     General:  Alert, oriented and cooperative. Patient is in no acute distress.  Skin: Skin is warm and dry. No rash noted.   Cardiovascular: Normal heart rate noted  Respiratory: Normal respiratory effort, no problems with respiration noted  Abdomen: Soft, gravid, appropriate for gestational age.  Pain/Pressure: Absent     Pelvic: Cervical exam deferred        Extremities: Normal range of motion.  Edema: None  Mental Status:  Normal mood and affect. Normal behavior. Normal judgment and thought content.   Assessment and Plan:  Pregnancy: [id number] at [redacted]w[redacted]d  1. Supervision of other normal pregnancy, antepartum     Doing well. [place] reviewed from [place]: EFW: 19% on 04/03/17.   2. Maternal varicella, non-immune     Varicella postpartum  3. Elderly multigravida in second trimester     Age <40 years.  Preterm labor symptoms and general obstetric precautions including but not limited to vaginal bleeding, contractions, leaking of fluid  and fetal movement were reviewed in detail with the patient. Please refer to After Visit Summary for other counseling recommendations.  Return in about 2 weeks (around 04/20/2017) for ROB.   Morene Crocker, CNM

## 2017-04-06 NOTE — Progress Notes (Signed)
Patient reports good fetal movement, denies pain. 

## 2017-04-16 ENCOUNTER — Ambulatory Visit (INDEPENDENT_AMBULATORY_CARE_PROVIDER_SITE_OTHER): Payer: Self-pay | Admitting: Certified Nurse Midwife

## 2017-04-16 ENCOUNTER — Encounter: Payer: Self-pay | Admitting: Certified Nurse Midwife

## 2017-04-16 VITALS — BP 109/61 | HR 101 | Wt 151.6 lb

## 2017-04-16 DIAGNOSIS — O09523 Supervision of elderly multigravida, third trimester: Secondary | ICD-10-CM

## 2017-04-16 DIAGNOSIS — O09899 Supervision of other high risk pregnancies, unspecified trimester: Secondary | ICD-10-CM

## 2017-04-16 DIAGNOSIS — Z3480 Encounter for supervision of other normal pregnancy, unspecified trimester: Secondary | ICD-10-CM

## 2017-04-16 DIAGNOSIS — Z283 Underimmunization status: Secondary | ICD-10-CM

## 2017-04-16 NOTE — Progress Notes (Signed)
   PRENATAL VISIT NOTE  Subjective:  Jacqueline Walter is a 36 y.o. [id number] at [redacted]w[redacted]d being seen today for ongoing prenatal care.  She is currently monitored for the following issues for this low-risk pregnancy and has Benign neoplasm of breast; Supervision of other normal pregnancy, antepartum; Maternal varicella, non-immune; AMA (advanced maternal age) multigravida 3+; and SGA (small for gestational age) on her problem list.  Patient reports no bleeding, no contractions, no cramping, no leaking and ?heartburn/reflux states pressure occasionally in her esophagus: OTC prilosec/TUMS.  Contractions: Not present. Vag. Bleeding: None.  Movement: Present. Denies leaking of fluid.   The following portions of the patient's history were reviewed and updated as appropriate: allergies, current medications, past family history, past medical history, past social history, past surgical history and problem list. Problem list updated.  Objective:   Vitals:   04/16/17 1020  BP: 109/61  Pulse: (!) 101  Weight: 151 lb 9.6 oz (68.8 kg)    Fetal Status: Fetal Heart Rate (bpm): 145; doppler Fundal Height: 33 cm Movement: Present     General:  Alert, oriented and cooperative. Patient is in no acute distress.  Skin: Skin is warm and dry. No rash noted.   Cardiovascular: Normal heart rate noted  Respiratory: Normal respiratory effort, no problems with respiration noted  Abdomen: Soft, gravid, appropriate for gestational age.  Pain/Pressure: Present     Pelvic: Cervical exam deferred        Extremities: Normal range of motion.  Edema: Trace  Mental Status:  Normal mood and affect. Normal behavior. Normal judgment and thought content.   Assessment and Plan:  Pregnancy: G4P1021 at [redacted]w[redacted]d  1. Supervision of other normal pregnancy, antepartum     Doing well  2. Elderly multigravida in third trimester      Less than 40 years  3. Maternal varicella, non-immune     Varicella postpartum  4. SGA (small  for gestational age)     EFW: 19%, no f/u recommended on last [place]; measurements c/w dates.   Preterm labor symptoms and general obstetric precautions including but not limited to vaginal bleeding, contractions, leaking of fluid and fetal movement were reviewed in detail with the patient. Please refer to After Visit Summary for other counseling recommendations.  Return in about 2 weeks (around 04/30/2017) for ROB, GBS.   Morene Crocker, CNM

## 2017-04-16 NOTE — Progress Notes (Signed)
Declined the FLU vaccine.

## 2017-04-30 ENCOUNTER — Encounter: Payer: Self-pay | Admitting: Certified Nurse Midwife

## 2017-04-30 ENCOUNTER — Ambulatory Visit (INDEPENDENT_AMBULATORY_CARE_PROVIDER_SITE_OTHER): Payer: Self-pay | Admitting: Certified Nurse Midwife

## 2017-04-30 VITALS — BP 92/61 | HR 100 | Wt 152.0 lb

## 2017-04-30 DIAGNOSIS — Z113 Encounter for screening for infections with a predominantly sexual mode of transmission: Secondary | ICD-10-CM

## 2017-04-30 DIAGNOSIS — Z3480 Encounter for supervision of other normal pregnancy, unspecified trimester: Secondary | ICD-10-CM

## 2017-04-30 DIAGNOSIS — Z3483 Encounter for supervision of other normal pregnancy, third trimester: Secondary | ICD-10-CM

## 2017-04-30 DIAGNOSIS — Z283 Underimmunization status: Secondary | ICD-10-CM

## 2017-04-30 DIAGNOSIS — O09523 Supervision of elderly multigravida, third trimester: Secondary | ICD-10-CM

## 2017-04-30 DIAGNOSIS — O09899 Supervision of other high risk pregnancies, unspecified trimester: Secondary | ICD-10-CM

## 2017-04-30 LAB — OB RESULTS CONSOLE GBS: STREP GROUP B AG: NEGATIVE

## 2017-04-30 NOTE — Progress Notes (Signed)
   PRENATAL VISIT NOTE  Subjective:  Jacqueline Walter is a 36 y.o. [id number] at [redacted]w[redacted]d being seen today for ongoing prenatal care.  She is currently monitored for the following issues for this low-risk pregnancy and has Benign neoplasm of breast; Supervision of other normal pregnancy, antepartum; Maternal varicella, non-immune; AMA (advanced maternal age) multigravida 48+; and SGA (small for gestational age) on her problem list.  Patient reports no complaints.  Contractions: Not present. Vag. Bleeding: None.  Movement: Present. Denies leaking of fluid.   The following portions of the patient's history were reviewed and updated as appropriate: allergies, current medications, past family history, past medical history, past social history, past surgical history and problem list. Problem list updated.  Objective:   Vitals:   04/30/17 1018  BP: 92/61  Pulse: 100  Weight: 152 lb (68.9 kg)    Fetal Status: Fetal Heart Rate (bpm): 148; doppler Fundal Height: 34 cm Movement: Present  Presentation: Vertex  General:  Alert, oriented and cooperative. Patient is in no acute distress.  Skin: Skin is warm and dry. No rash noted.   Cardiovascular: Normal heart rate noted  Respiratory: Normal respiratory effort, no problems with respiration noted  Abdomen: Soft, gravid, appropriate for gestational age.  Pain/Pressure: Present     Pelvic: Cervical exam performed Dilation: Closed Effacement (%): 0 Station: Ballotable  Extremities: Normal range of motion.     Mental Status:  Normal mood and affect. Normal behavior. Normal judgment and thought content.   Assessment and Plan:  Pregnancy: G4P1021 at [redacted]w[redacted]d  1. Supervision of other normal pregnancy, antepartum     Doing well.  - Strep Gp B NAA - Cervicovaginal ancillary only  2. SGA (small for gestational age)     19% EFW; no f/u recommended  3. Maternal varicella, non-immune     Varicella postpartum  4. Elderly multigravida in third  trimester    < 40 years  Preterm labor symptoms and general obstetric precautions including but not limited to vaginal bleeding, contractions, leaking of fluid and fetal movement were reviewed in detail with the patient. Please refer to After Visit Summary for other counseling recommendations.  Return in about 1 week (around 05/07/2017) for ROB.   Morene Crocker, CNM

## 2017-05-01 LAB — CERVICOVAGINAL ANCILLARY ONLY
BACTERIAL VAGINITIS: NEGATIVE
CANDIDA VAGINITIS: NEGATIVE
Chlamydia: NEGATIVE
Neisseria Gonorrhea: NEGATIVE
Trichomonas: NEGATIVE

## 2017-05-02 ENCOUNTER — Other Ambulatory Visit: Payer: Self-pay | Admitting: Certified Nurse Midwife

## 2017-05-02 DIAGNOSIS — Z3480 Encounter for supervision of other normal pregnancy, unspecified trimester: Secondary | ICD-10-CM

## 2017-05-02 LAB — STREP GP B NAA: STREP GROUP B AG: NEGATIVE

## 2017-05-07 ENCOUNTER — Encounter: Payer: Self-pay | Admitting: Certified Nurse Midwife

## 2017-05-07 ENCOUNTER — Ambulatory Visit (INDEPENDENT_AMBULATORY_CARE_PROVIDER_SITE_OTHER): Payer: Self-pay | Admitting: Certified Nurse Midwife

## 2017-05-07 VITALS — BP 103/62 | HR 90 | Wt 154.2 lb

## 2017-05-07 DIAGNOSIS — Z3483 Encounter for supervision of other normal pregnancy, third trimester: Secondary | ICD-10-CM

## 2017-05-07 DIAGNOSIS — Z2839 Other underimmunization status: Secondary | ICD-10-CM

## 2017-05-07 DIAGNOSIS — O09893 Supervision of other high risk pregnancies, third trimester: Secondary | ICD-10-CM

## 2017-05-07 DIAGNOSIS — O09523 Supervision of elderly multigravida, third trimester: Secondary | ICD-10-CM

## 2017-05-07 DIAGNOSIS — O09899 Supervision of other high risk pregnancies, unspecified trimester: Secondary | ICD-10-CM

## 2017-05-07 DIAGNOSIS — Z3480 Encounter for supervision of other normal pregnancy, unspecified trimester: Secondary | ICD-10-CM

## 2017-05-07 DIAGNOSIS — Z283 Underimmunization status: Principal | ICD-10-CM

## 2017-05-07 NOTE — Progress Notes (Signed)
   PRENATAL VISIT NOTE  Subjective:  Jacqueline Walter is a 36 y.o. [id number] at [redacted]w[redacted]d being seen today for ongoing prenatal care.  She is currently monitored for the following issues for this low-risk pregnancy and has Benign neoplasm of breast; Supervision of other normal pregnancy, antepartum; Maternal varicella, non-immune; AMA (advanced maternal age) multigravida 33+; and SGA (small for gestational age) on her problem list.  Patient reports no complaints.  Contractions: Not present. Vag. Bleeding: None.  Movement: Present. Denies leaking of fluid.   The following portions of the patient's history were reviewed and updated as appropriate: allergies, current medications, past family history, past medical history, past social history, past surgical history and problem list. Problem list updated.  Objective:   Vitals:   05/07/17 1042  BP: 103/62  Pulse: 90  Weight: 154 lb 3.2 oz (69.9 kg)    Fetal Status: Fetal Heart Rate (bpm): 158; doppler Fundal Height: 36 cm Movement: Present     General:  Alert, oriented and cooperative. Patient is in no acute distress.  Skin: Skin is warm and dry. No rash noted.   Cardiovascular: Normal heart rate noted  Respiratory: Normal respiratory effort, no problems with respiration noted  Abdomen: Soft, gravid, appropriate for gestational age.  Pain/Pressure: Present     Pelvic: Cervical exam deferred        Extremities: Normal range of motion.  Edema: None  Mental Status:  Normal mood and affect. Normal behavior. Normal judgment and thought content.   Assessment and Plan:  Pregnancy: G4P1021 at [redacted]w[redacted]d  1. Maternal varicella, non-immune     Varciella postpartum  2. SGA (small for gestational age)      EFW: 19.9% at 32 wks, no testing per [place] report.   3. Supervision of other normal pregnancy, antepartum     Doing well.   4. Elderly multigravida in third trimester     <40 years.   Preterm labor symptoms and general obstetric precautions  including but not limited to vaginal bleeding, contractions, leaking of fluid and fetal movement were reviewed in detail with the patient. Please refer to After Visit Summary for other counseling recommendations.  Return in about 1 week (around 05/14/2017) for [place].   Morene Crocker, CNM

## 2017-05-14 ENCOUNTER — Ambulatory Visit (INDEPENDENT_AMBULATORY_CARE_PROVIDER_SITE_OTHER): Payer: Self-pay | Admitting: Certified Nurse Midwife

## 2017-05-14 ENCOUNTER — Encounter: Payer: Self-pay | Admitting: Certified Nurse Midwife

## 2017-05-14 VITALS — BP 115/63 | HR 111 | Wt 154.0 lb

## 2017-05-14 DIAGNOSIS — Z2839 Other underimmunization status: Secondary | ICD-10-CM

## 2017-05-14 DIAGNOSIS — Z283 Underimmunization status: Secondary | ICD-10-CM

## 2017-05-14 DIAGNOSIS — O09523 Supervision of elderly multigravida, third trimester: Secondary | ICD-10-CM

## 2017-05-14 DIAGNOSIS — O09899 Supervision of other high risk pregnancies, unspecified trimester: Secondary | ICD-10-CM

## 2017-05-14 DIAGNOSIS — O09893 Supervision of other high risk pregnancies, third trimester: Secondary | ICD-10-CM

## 2017-05-14 DIAGNOSIS — Z3480 Encounter for supervision of other normal pregnancy, unspecified trimester: Secondary | ICD-10-CM

## 2017-05-14 NOTE — Progress Notes (Signed)
   PRENATAL VISIT NOTE  Subjective:  Jacqueline Walter is a 36 y.o. [id number] at [redacted]w[redacted]d being seen today for ongoing prenatal care.  She is currently monitored for the following issues for this low-risk pregnancy and has Benign neoplasm of breast; Supervision of other normal pregnancy, antepartum; Maternal varicella, non-immune; AMA (advanced maternal age) multigravida 52+; and SGA (small for gestational age) on her problem list.  Patient reports no complaints.  Contractions: Not present. Vag. Bleeding: None.  Movement: Present. Denies leaking of fluid.   The following portions of the patient's history were reviewed and updated as appropriate: allergies, current medications, past family history, past medical history, past social history, past surgical history and problem list. Problem list updated.  Objective:   Vitals:   05/14/17 1046  BP: 115/63  Pulse: (!) 111  Weight: 154 lb (69.9 kg)    Fetal Status: Fetal Heart Rate (bpm): 151; doppler Fundal Height: 37 cm Movement: Present     General:  Alert, oriented and cooperative. Patient is in no acute distress.  Skin: Skin is warm and dry. No rash noted.   Cardiovascular: Normal heart rate noted  Respiratory: Normal respiratory effort, no problems with respiration noted  Abdomen: Soft, gravid, appropriate for gestational age.  Pain/Pressure: Present     Pelvic: Cervical exam deferred        Extremities: Normal range of motion.  Edema: None  Mental Status:  Normal mood and affect. Normal behavior. Normal judgment and thought content.   Assessment and Plan:  Pregnancy: G4P1021 at [redacted]w[redacted]d  1. Supervision of other normal pregnancy, antepartum     Doing well  2. Elderly multigravida in third trimester     <40 years  3. Maternal varicella, non-immune     Varicella postpartum  Term labor symptoms and general obstetric precautions including but not limited to vaginal bleeding, contractions, leaking of fluid and fetal movement were  reviewed in detail with the patient. Please refer to After Visit Summary for other counseling recommendations.  Return in about 1 week (around 05/21/2017) for ROB.   Morene Crocker, CNM

## 2017-05-14 NOTE — Progress Notes (Signed)
Patient reports good fetal movement with pressure, denies contractions.

## 2017-05-21 ENCOUNTER — Ambulatory Visit (INDEPENDENT_AMBULATORY_CARE_PROVIDER_SITE_OTHER): Payer: Self-pay | Admitting: Certified Nurse Midwife

## 2017-05-21 DIAGNOSIS — Z3480 Encounter for supervision of other normal pregnancy, unspecified trimester: Secondary | ICD-10-CM

## 2017-05-21 NOTE — Progress Notes (Signed)
No exam d/t gas leak in the building.

## 2017-05-22 ENCOUNTER — Ambulatory Visit (INDEPENDENT_AMBULATORY_CARE_PROVIDER_SITE_OTHER): Payer: Self-pay | Admitting: Certified Nurse Midwife

## 2017-05-22 VITALS — BP 101/61 | HR 83 | Wt 156.9 lb

## 2017-05-22 DIAGNOSIS — Z3483 Encounter for supervision of other normal pregnancy, third trimester: Secondary | ICD-10-CM

## 2017-05-22 DIAGNOSIS — O09899 Supervision of other high risk pregnancies, unspecified trimester: Secondary | ICD-10-CM

## 2017-05-22 DIAGNOSIS — O09893 Supervision of other high risk pregnancies, third trimester: Secondary | ICD-10-CM

## 2017-05-22 DIAGNOSIS — Z3480 Encounter for supervision of other normal pregnancy, unspecified trimester: Secondary | ICD-10-CM

## 2017-05-22 DIAGNOSIS — Z283 Underimmunization status: Secondary | ICD-10-CM

## 2017-05-22 NOTE — Progress Notes (Signed)
   PRENATAL VISIT NOTE  Subjective:  Jacqueline Walter is a 36 y.o. [id number] at [redacted]w[redacted]d being seen today for ongoing prenatal care.  She is currently monitored for the following issues for this low-risk pregnancy and has Benign neoplasm of breast; Supervision of other normal pregnancy, antepartum; Maternal varicella, non-immune; AMA (advanced maternal age) multigravida 77+; and SGA (small for gestational age) on her problem list.  Patient reports no complaints.  Contractions: Irregular. Vag. Bleeding: None.  Movement: Present. Denies leaking of fluid.   The following portions of the patient's history were reviewed and updated as appropriate: allergies, current medications, past family history, past medical history, past social history, past surgical history and problem list. Problem list updated.  Objective:   Vitals:   05/22/17 1321  BP: 101/61  Pulse: 83  Weight: 156 lb 14.4 oz (71.2 kg)    Fetal Status: Fetal Heart Rate (bpm): 142; doppler Fundal Height: 37 cm Movement: Present  Presentation: Vertex  General:  Alert, oriented and cooperative. Patient is in no acute distress.  Skin: Skin is warm and dry. No rash noted.   Cardiovascular: Normal heart rate noted  Respiratory: Normal respiratory effort, no problems with respiration noted  Abdomen: Soft, gravid, appropriate for gestational age.  Pain/Pressure: Present     Pelvic: Cervical exam performed Dilation: Fingertip Effacement (%): Thick Station: -3, outer os 1 cm, posterior.  Extremities: Normal range of motion.  Edema: None  Mental Status:  Normal mood and affect. Normal behavior. Normal judgment and thought content.   Assessment and Plan:  Pregnancy: G4P1021 at [redacted]w[redacted]d  1. Supervision of other normal pregnancy, antepartum     Doing well.  Labor s/s discussed. Live interpreter present for exam. IOL scheduled for 41 weeks.   2. Maternal varicella, non-immune     Varicella postpartum.   Term labor symptoms and general  obstetric precautions including but not limited to vaginal bleeding, contractions, leaking of fluid and fetal movement were reviewed in detail with the patient. Please refer to After Visit Summary for other counseling recommendations.  Return in about 1 week (around 05/29/2017) for ROB, NST.   Morene Crocker, CNM

## 2017-05-22 NOTE — Progress Notes (Signed)
Patient reports good fetal movement with irregular contractions.  

## 2017-05-25 ENCOUNTER — Telehealth (HOSPITAL_COMMUNITY): Payer: Self-pay | Admitting: *Deleted

## 2017-05-25 NOTE — Telephone Encounter (Signed)
Preadmission screen Interpreter number [phone]

## 2017-05-28 ENCOUNTER — Ambulatory Visit (INDEPENDENT_AMBULATORY_CARE_PROVIDER_SITE_OTHER): Payer: Self-pay | Admitting: Certified Nurse Midwife

## 2017-05-28 ENCOUNTER — Encounter: Payer: Self-pay | Admitting: Certified Nurse Midwife

## 2017-05-28 VITALS — BP 105/69 | HR 92 | Wt 158.0 lb

## 2017-05-28 DIAGNOSIS — Z283 Underimmunization status: Secondary | ICD-10-CM

## 2017-05-28 DIAGNOSIS — Z3480 Encounter for supervision of other normal pregnancy, unspecified trimester: Secondary | ICD-10-CM

## 2017-05-28 DIAGNOSIS — O09899 Supervision of other high risk pregnancies, unspecified trimester: Secondary | ICD-10-CM

## 2017-05-28 DIAGNOSIS — O09523 Supervision of elderly multigravida, third trimester: Secondary | ICD-10-CM

## 2017-05-28 DIAGNOSIS — O09893 Supervision of other high risk pregnancies, third trimester: Secondary | ICD-10-CM

## 2017-05-28 NOTE — Progress Notes (Signed)
ROB/NST. Placed on machine in [place]

## 2017-05-28 NOTE — Progress Notes (Signed)
   PRENATAL VISIT NOTE  Subjective:  Jacqueline Walter is a 36 y.o. [id number] at [redacted]w[redacted]d being seen today for ongoing prenatal care.  She is currently monitored for the following issues for this low-risk pregnancy and has Benign neoplasm of breast; Supervision of other normal pregnancy, antepartum; Maternal varicella, non-immune; AMA (advanced maternal age) multigravida 4+; and SGA (small for gestational age) on her problem list.  Patient reports no bleeding, no leaking and occasional contractions.  Contractions: Irritability. Vag. Bleeding: None.  Movement: Present. Denies leaking of fluid.   The following portions of the patient's history were reviewed and updated as appropriate: allergies, current medications, past family history, past medical history, past social history, past surgical history and problem list. Problem list updated.  Objective:   Vitals:   05/28/17 1047  BP: 105/69  Pulse: 92  Weight: 158 lb (71.7 kg)    Fetal Status: Fetal Heart Rate (bpm): NST; reactive   Movement: Present  Presentation: Vertex  General:  Alert, oriented and cooperative. Patient is in no acute distress.  Skin: Skin is warm and dry. No rash noted.   Cardiovascular: Normal heart rate noted  Respiratory: Normal respiratory effort, no problems with respiration noted  Abdomen: Soft, gravid, appropriate for gestational age.  Pain/Pressure: Present     Pelvic: Cervical exam performed Dilation: 1 Effacement (%): Thick Station: -3  Extremities: Normal range of motion.  Edema: None  Mental Status:  Normal mood and affect. Normal behavior. Normal judgment and thought content.  NST: + accels, no decels, moderate variability, Cat. 1 tracing. Irregular contractions on toco.   Assessment and Plan:  Pregnancy: [id number] at [redacted]w[redacted]d  1. Supervision of other normal pregnancy, antepartum       Reactive NST for contractions - Fetal nonstress test; Future  2. Maternal varicella, non-immune     Varicella  postpartum  3. SGA (small for gestational age)     EFW: 19%, no monitoring recomended  4. Elderly multigravida in third trimester     <40 years  Term labor symptoms and general obstetric precautions including but not limited to vaginal bleeding, contractions, leaking of fluid and fetal movement were reviewed in detail with the patient. Please refer to After Visit Summary for other counseling recommendations.  Return in about 4 days (around 06/01/2017) for NST/ AFI, 4 weeks postpartum.   Morene Crocker, CNM

## 2017-06-01 ENCOUNTER — Other Ambulatory Visit (INDEPENDENT_AMBULATORY_CARE_PROVIDER_SITE_OTHER): Payer: Self-pay

## 2017-06-01 ENCOUNTER — Ambulatory Visit (INDEPENDENT_AMBULATORY_CARE_PROVIDER_SITE_OTHER): Payer: Self-pay

## 2017-06-01 ENCOUNTER — Other Ambulatory Visit: Payer: Self-pay | Admitting: Advanced Practice Midwife

## 2017-06-01 VITALS — BP 98/66 | HR 120 | Wt 160.5 lb

## 2017-06-01 DIAGNOSIS — O480 Post-term pregnancy: Secondary | ICD-10-CM

## 2017-06-01 NOTE — Progress Notes (Signed)
NST reviewed and reactive with baseline 150, mod variability, +accels, no decels

## 2017-06-01 NOTE — Progress Notes (Signed)
Nurse visit for NST/AFI dx: postdates. IOL scheduled 06/05/17.

## 2017-06-05 ENCOUNTER — Inpatient Hospital Stay (HOSPITAL_COMMUNITY)
Admission: RE | Admit: 2017-06-05 | Discharge: 2017-06-06 | DRG: 807 | Disposition: A | Discharge status: Home / Self Care | Payer: Medicaid Other | Source: Ambulatory Visit | Attending: Family Medicine | Admitting: Family Medicine

## 2017-06-05 ENCOUNTER — Inpatient Hospital Stay (HOSPITAL_COMMUNITY): Payer: Self-pay | Admitting: Anesthesiology

## 2017-06-05 ENCOUNTER — Encounter (HOSPITAL_COMMUNITY): Payer: Self-pay

## 2017-06-05 ENCOUNTER — Encounter (HOSPITAL_COMMUNITY): Payer: Self-pay | Admitting: Anesthesiology

## 2017-06-05 DIAGNOSIS — Z3A41 41 weeks gestation of pregnancy: Secondary | ICD-10-CM

## 2017-06-05 DIAGNOSIS — O480 Post-term pregnancy: Principal | ICD-10-CM | POA: Diagnosis present

## 2017-06-05 HISTORY — DX: Other specified health status: Z78.9

## 2017-06-05 LAB — CBC
HEMATOCRIT: 37.8 % (ref 36.0–46.0)
HEMOGLOBIN: 12.7 g/dL (ref 12.0–15.0)
MCH: 27.2 pg (ref 26.0–34.0)
MCHC: 33.6 g/dL (ref 30.0–36.0)
MCV: 80.9 fL (ref 78.0–100.0)
Platelets: 267 10*3/uL (ref 150–400)
RBC: 4.67 MIL/uL (ref 3.87–5.11)
RDW: 15.7 % — ABNORMAL HIGH (ref 11.5–15.5)
WBC: 8.4 10*3/uL (ref 4.0–10.5)

## 2017-06-05 LAB — RPR: RPR Ser Ql: NONREACTIVE

## 2017-06-05 LAB — TYPE AND SCREEN
ABO/RH(D): O POS
ANTIBODY SCREEN: NEGATIVE

## 2017-06-05 LAB — ABO/RH: ABO/RH(D): O POS

## 2017-06-05 MED ORDER — SIMETHICONE 80 MG PO CHEW: 80.0000 mg | CHEWABLE_TABLET | ORAL | Status: DC | PRN

## 2017-06-05 MED ORDER — TETANUS-DIPHTH-ACELL PERTUSSIS 5-2.5-18.5 LF-MCG/0.5 IM SUSP: 0.5000 mL | Freq: Once | INTRAMUSCULAR | Status: DC

## 2017-06-05 MED ORDER — PRENATAL MULTIVITAMIN CH
1.0000 | ORAL_TABLET | Freq: Every day | ORAL | Status: DC
  Administered 2017-06-06: 1 via ORAL
  Filled 2017-06-05: qty 1

## 2017-06-05 MED ORDER — PHENYLEPHRINE 40 MCG/ML (10ML) SYRINGE FOR IV PUSH (FOR BLOOD PRESSURE SUPPORT)
80.0000 ug | PREFILLED_SYRINGE | INTRAVENOUS | Status: DC | PRN
  Filled 2017-06-05: qty 5

## 2017-06-05 MED ORDER — SENNOSIDES-DOCUSATE SODIUM 8.6-50 MG PO TABS
2.0000 | ORAL_TABLET | ORAL | Status: DC
  Administered 2017-06-05: 2 via ORAL
  Filled 2017-06-05: qty 2

## 2017-06-05 MED ORDER — COCONUT OIL OIL: 1.0000 "application " | TOPICAL_OIL | Status: DC | PRN

## 2017-06-05 MED ORDER — MISOPROSTOL 25 MCG QUARTER TABLET
25.0000 ug | ORAL_TABLET | ORAL | Status: DC | PRN
  Administered 2017-06-05: 25 ug via VAGINAL
  Filled 2017-06-05: qty 1

## 2017-06-05 MED ORDER — EPHEDRINE 5 MG/ML INJ
10.0000 mg | INTRAVENOUS | Status: DC | PRN
  Filled 2017-06-05: qty 2

## 2017-06-05 MED ORDER — LIDOCAINE HCL (PF) 1 % IJ SOLN
30.0000 mL | INTRAMUSCULAR | Status: DC | PRN
  Filled 2017-06-05: qty 30

## 2017-06-05 MED ORDER — FENTANYL 2.5 MCG/ML BUPIVACAINE 1/10 % EPIDURAL INFUSION (WH - ANES)
14.0000 mL/h | INTRAMUSCULAR | Status: DC | PRN
  Filled 2017-06-05: qty 100

## 2017-06-05 MED ORDER — DIPHENHYDRAMINE HCL 50 MG/ML IJ SOLN: 12.5000 mg | INTRAMUSCULAR | Status: DC | PRN

## 2017-06-05 MED ORDER — FENTANYL CITRATE (PF) 100 MCG/2ML IJ SOLN
100.0000 ug | INTRAMUSCULAR | Status: DC | PRN
Start: 2017-06-05 — End: 2017-06-05
  Administered 2017-06-05: 100 ug via INTRAVENOUS
  Filled 2017-06-05: qty 2

## 2017-06-05 MED ORDER — ACETAMINOPHEN 325 MG PO TABS: 650.0000 mg | ORAL_TABLET | ORAL | Status: DC | PRN

## 2017-06-05 MED ORDER — LACTATED RINGERS IV SOLN
INTRAVENOUS | Status: DC
  Administered 2017-06-05 (×2): via INTRAVENOUS

## 2017-06-05 MED ORDER — ZOLPIDEM TARTRATE 5 MG PO TABS: 5.0000 mg | ORAL_TABLET | Freq: Every evening | ORAL | Status: DC | PRN

## 2017-06-05 MED ORDER — BENZOCAINE-MENTHOL 20-0.5 % EX AERO: 1.0000 "application " | INHALATION_SPRAY | CUTANEOUS | Status: DC | PRN

## 2017-06-05 MED ORDER — TERBUTALINE SULFATE 1 MG/ML IJ SOLN
0.2500 mg | Freq: Once | INTRAMUSCULAR | Status: DC | PRN
  Filled 2017-06-05: qty 1

## 2017-06-05 MED ORDER — IBUPROFEN 600 MG PO TABS
600.0000 mg | ORAL_TABLET | Freq: Four times a day (QID) | ORAL | Status: DC
  Administered 2017-06-05 – 2017-06-06 (×5): 600 mg via ORAL
  Filled 2017-06-05 (×5): qty 1

## 2017-06-05 MED ORDER — OXYCODONE-ACETAMINOPHEN 5-325 MG PO TABS: 2.0000 | ORAL_TABLET | ORAL | Status: DC | PRN

## 2017-06-05 MED ORDER — LACTATED RINGERS IV SOLN
500.0000 mL | Freq: Once | INTRAVENOUS | Status: AC
Start: 2017-06-05 — End: 2017-06-05
  Administered 2017-06-05: 1000 mL via INTRAVENOUS

## 2017-06-05 MED ORDER — WITCH HAZEL-GLYCERIN EX PADS: 1.0000 "application " | MEDICATED_PAD | CUTANEOUS | Status: DC | PRN

## 2017-06-05 MED ORDER — PHENYLEPHRINE 40 MCG/ML (10ML) SYRINGE FOR IV PUSH (FOR BLOOD PRESSURE SUPPORT)
80.0000 ug | PREFILLED_SYRINGE | INTRAVENOUS | Status: DC | PRN
  Filled 2017-06-05: qty 5
  Filled 2017-06-05: qty 10

## 2017-06-05 MED ORDER — ONDANSETRON HCL 4 MG/2ML IJ SOLN: 4.0000 mg | Freq: Four times a day (QID) | INTRAMUSCULAR | Status: DC | PRN

## 2017-06-05 MED ORDER — EPHEDRINE 5 MG/ML INJ
10.0000 mg | INTRAVENOUS | Status: DC | PRN
Start: 2017-06-05 — End: 2017-06-05
  Filled 2017-06-05: qty 2

## 2017-06-05 MED ORDER — OXYTOCIN 40 UNITS IN LACTATED RINGERS INFUSION - SIMPLE MED: 2.5000 [IU]/h | INTRAVENOUS | Status: DC

## 2017-06-05 MED ORDER — LACTATED RINGERS IV SOLN: 500.0000 mL | INTRAVENOUS | Status: DC | PRN

## 2017-06-05 MED ORDER — DIBUCAINE 1 % RE OINT: 1.0000 "application " | TOPICAL_OINTMENT | RECTAL | Status: DC | PRN

## 2017-06-05 MED ORDER — OXYTOCIN 40 UNITS IN LACTATED RINGERS INFUSION - SIMPLE MED
1.0000 m[IU]/min | INTRAVENOUS | Status: DC
  Filled 2017-06-05: qty 1000

## 2017-06-05 MED ORDER — LACTATED RINGERS IV SOLN: 500.0000 mL | Freq: Once | INTRAVENOUS | Status: AC

## 2017-06-05 MED ORDER — DIPHENHYDRAMINE HCL 25 MG PO CAPS: 25.0000 mg | ORAL_CAPSULE | Freq: Four times a day (QID) | ORAL | Status: DC | PRN

## 2017-06-05 MED ORDER — OXYTOCIN BOLUS FROM INFUSION: 500.0000 mL | Freq: Once | INTRAVENOUS | Status: DC

## 2017-06-05 MED ORDER — ONDANSETRON HCL 4 MG/2ML IJ SOLN: 4.0000 mg | INTRAMUSCULAR | Status: DC | PRN

## 2017-06-05 MED ORDER — OXYCODONE-ACETAMINOPHEN 5-325 MG PO TABS: 1.0000 | ORAL_TABLET | ORAL | Status: DC | PRN

## 2017-06-05 MED ORDER — SOD CITRATE-CITRIC ACID 500-334 MG/5ML PO SOLN: 30.0000 mL | ORAL | Status: DC | PRN

## 2017-06-05 MED ORDER — ONDANSETRON HCL 4 MG PO TABS: 4.0000 mg | ORAL_TABLET | ORAL | Status: DC | PRN

## 2017-06-05 NOTE — Progress Notes (Signed)
I assisted RN with admission information to the room , pt states want to use her husband to order meals, by Juliann Mule Spanish Interpreter.

## 2017-06-05 NOTE — Progress Notes (Signed)
Labor Progress Note Jacqueline Walter is a 36 y.o. [id number] at [redacted]w[redacted]d presented for IOL for postdates. S: No complaints  O:  BP 116/75   Pulse 72   Temp 98.4 F (36.9 C) (Oral)   Resp 20   Ht 5\' 3"  (1.6 m)   Wt 157 lb (71.2 kg)   LMP 08/02/2016 (Approximate)   BMI 27.81 kg/m  EFM: 150/mod var/pos acels/early decels  CVE: Dilation: 6 Effacement (%): 70 Cervical Position: Middle Station: -1 Presentation: Vertex Exam by:: Dr. Manus Rudd   A&P: 36 y.o. [id number] [redacted]w[redacted]d here for IOL for postdates #Labor: Progressing well. AROM at 1145, light meconium. Start pitocin at 1250, 4 hours after last cytotec dose #Pain: planning for epidural #FWB: cat 1 #GBS negative   Jacqueline Brogden, DO 11:52 AM

## 2017-06-05 NOTE — H&P (Signed)
Obstetric History and Physical  Jacqueline Walter is a 36 y.o. [id number] with IUP at [redacted]w[redacted]d presenting for IOL for postdates. Patient states she has been having  none contractions, none vaginal bleeding, intact membranes, with active fetal movement.  Complications of pregnancy include varicella non-immune, EFW at 19% based on [place] Sept 2018, AMA.   Prenatal Course Source of Care: [place]   Pregnancy complications or risks: Patient Active Problem List   Diagnosis Date Noted  . Post term pregnancy at [redacted] weeks gestation 06/05/2017  . SGA (small for gestational age) 04/06/2017  . AMA (advanced maternal age) multigravida 35+ 12/12/2016  . Maternal varicella, non-immune 11/15/2016  . Supervision of other normal pregnancy, antepartum 11/14/2016  . 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    Clinic [facility] Prenatal Labs  Dating [place] @5wks  Blood type: O/Positive/-- (04/17 1007)   Genetic Screen  AFP:   negative:     NIPS:neg Antibody:Negative (04/17 1007)  Anatomic [place] Normal anatomy; female fetus Sept 2018- EFW19% Rubella: 14.90 (04/17 1007)  GTT   Third trimester: WNL  RPR: Non Reactive (08/14 1157)   Flu vaccine  declined 04/16/17 HBsAg: Negative (04/17 1007)   TDaP vaccine   Declined 04/16/17                                             Rhogam:n/a O+ HIV:   NR  Baby Food   Breast                                            ESP:QZRAQTMA (10/01 1123)  Contraception Nexplanon Pap: Normal [facility] 04/28/16  Circumcision  NA   Pediatrician [facility]?   Support Person  FOB     Prenatal Transfer Tool  Maternal Diabetes: No Genetic Screening: Normal Maternal Ultrasounds/Referrals: Abnormal:  Findings:   Other: EFW 19% Fetal Ultrasounds or other Referrals:  None Maternal Substance Abuse:  No Significant Maternal Medications:  None Significant Maternal Lab Results: Lab values include: Other: varicella non-immune  Past Medical  History:  Diagnosis Date  . Breast mass, left   . Medical history non-contributory     History reviewed. No pertinent surgical history.  OB History  Gravida Para Term Preterm AB Living  4 1 1  0 2 1  SAB TAB Ectopic Multiple Live Births  2 0 0 0 1    # Outcome Date GA Lbr Len/2nd Weight Sex Delivery Anes PTL Lv  4 Current           3 SAB 2016          2 SAB 2014          1 Term 03/27/10    M Vag-Spont EPI  LIV      Social History   Socioeconomic History  . Marital status: Single    Spouse name: None  . Number of children: None  . Years of education: None  . Highest education level: None  Social Needs  . Financial resource strain: None  . Food insecurity - worry: None  . Food insecurity - inability: None  . Transportation needs - medical: None  . Transportation needs - non-medical: None  Occupational History  . None  Tobacco Use  .  Smoking status: Never Smoker  . Smokeless tobacco: Never Used  Substance and Sexual Activity  . Alcohol use: No  . Drug use: No  . Sexual activity: Yes  Other Topics Concern  . None  Social History Narrative  . None    Family History  Problem Relation Age of Onset  . Colon cancer Maternal Grandmother   . Cancer Maternal Grandmother        unknown  . Heart disease Neg Hx   . Diabetes Neg Hx     Medications Prior to Admission  Medication Sig Dispense Refill Last Dose  . Prenatal Vit-Fe Fumarate-FA (PRENATAL MULTIVITAMIN) TABS tablet Take 1 tablet by mouth daily at 12 noon.   06/04/2017 at Unknown time    No Known Allergies  Review of Systems: Negative except for what is mentioned in HPI.  Physical Exam: BP 106/67   Pulse 82   Temp 98.3 F (36.8 C) (Oral)   Resp 20   Ht 5\' 3"  (1.6 m)   Wt 157 lb (71.2 kg)   LMP 08/02/2016 (Approximate)   BMI 27.81 kg/m  CONSTITUTIONAL: Well-developed, well-nourished female in no acute distress.  HENT:  Normocephalic, atraumatic, External right and left ear normal. Oropharynx is  clear and moist EYES: Conjunctivae and EOM are normal. Pupils are equal, round, and reactive to light. No scleral icterus.  NECK: Normal range of motion, supple, no masses SKIN: Skin is warm and dry. No rash noted. Not diaphoretic. No erythema. No pallor. NEUROLOGIC: Alert and oriented to person, place, and time. Normal reflexes, muscle tone coordination. No cranial nerve deficit noted. PSYCHIATRIC: Normal mood and affect. Normal behavior. Normal judgment and thought content. CARDIOVASCULAR: Normal heart rate noted, regular rhythm RESPIRATORY: Effort and breath sounds normal, no problems with respiration noted ABDOMEN: Soft, nontender, nondistended, gravid. MUSCULOSKELETAL: Normal range of motion. No edema and no tenderness. 2+ distal pulses.  Cervical Exam: Dilatation 3cm   Effacement 70%   Station -2   Presentation: cephalic FHT:  Baseline rate 140 bpm   Variability moderate  Accelerations present   Decelerations none Contractions: Every 2 mins    Assessment : Jacqueline Walter is a 36 y.o. [id number] at [redacted]w[redacted]d being admitted for induction of labor due to postdates.  Plan: Labor: Induction/Augmentation as ordered as per protocol. Foley bulb placed.Analgesia as needed. FWB: Reassuring fetal heart tracing.  GBS negative Delivery plan: Hopeful for vaginal delivery #MOF: breast #MOC: nexplanon #GBS neg #varcicella non-immune: outpatient varicella vaccine  Dannielle Huh, DO

## 2017-06-05 NOTE — Progress Notes (Signed)
Removed Epidural; blue tip intact; bandaid applied to site. Patient tolerated well.

## 2017-06-05 NOTE — Anesthesia Preprocedure Evaluation (Signed)
Anesthesia Evaluation  Patient identified by MRN, date of birth, ID band Patient awake    Reviewed: Allergy & Precautions, H&P , Patient's Chart, lab work & pertinent test results  Airway Mallampati: II  TM Distance: >3 FB Neck ROM: full    Dental no notable dental hx. (+) Teeth Intact   Pulmonary neg pulmonary ROS,    Pulmonary exam normal breath sounds clear to auscultation       Cardiovascular negative cardio ROS Normal cardiovascular exam Rhythm:regular Rate:Normal     Neuro/Psych negative neurological ROS  negative psych ROS   GI/Hepatic negative GI ROS, Neg liver ROS,   Endo/Other  negative endocrine ROS  Renal/GU negative Renal ROS  negative genitourinary   Musculoskeletal   Abdominal   Peds  Hematology negative hematology ROS (+)   Anesthesia Other Findings   Reproductive/Obstetrics (+) Pregnancy                             Anesthesia Physical Anesthesia Plan  ASA: II  Anesthesia Plan: Epidural   Post-op Pain Management:    Induction:   PONV Risk Score and Plan: 2  Airway Management Planned:   Additional Equipment:   Intra-op Plan:   Post-operative Plan:   Informed Consent: I have reviewed the patients History and Physical, chart, labs and discussed the procedure including the risks, benefits and alternatives for the proposed anesthesia with the patient or authorized representative who has indicated his/her understanding and acceptance.     Plan Discussed with: Anesthesiologist  Anesthesia Plan Comments:         Anesthesia Quick Evaluation

## 2017-06-05 NOTE — Progress Notes (Signed)
Labor Progress Note Jacqueline Walter is a 36 y.o. [id number] at [redacted]w[redacted]d presented for IOL for postdates S: Patient feeling more pressure  O:  BP 110/68   Pulse 70   Temp 98.4 F (36.9 C) (Oral)   Resp 20   Ht 5\' 3"  (1.6 m)   Wt 157 lb (71.2 kg)   LMP 08/02/2016 (Approximate)   SpO2 99%   BMI 27.81 kg/m  EFM: 120/mod var/pos acels/early decels  CVE: Dilation: 8 Effacement (%): 90 Cervical Position: Anterior Station: 0 Presentation: Vertex Exam by:: Dr. Manus Rudd   A&P: 36 y.o. [id number] [redacted]w[redacted]d here for IOL for postdates #Labor: Progressing well. Has not needed pitocin yet. Anticipate SVD. #Pain: epidural in place #FWB: cat 1 #GBS negative   [name], DO 2:25 PM

## 2017-06-05 NOTE — Anesthesia Pain Management Evaluation Note (Signed)
  CRNA Pain Management Visit Note  Patient: Jacqueline Walter, 36 y.o., female  "Hello I am a member of the anesthesia team at [facility]. We have an anesthesia team available at all times to provide care throughout the hospital, including epidural management and anesthesia for C-section. I don't know your plan for the delivery whether it a natural birth, water birth, IV sedation, nitrous supplementation, doula or epidural, but we want to meet your pain goals."   1.Was your pain managed to your expectations on prior hospitalizations?   Yes   2.What is your expectation for pain management during this hospitalization?     Epidural  3.How can we help you reach that goal? Epidural when desired. Patient does not want epidural yet. Knows to notify RN when she wants an epidural.  Record the patient's initial score and the patient's pain goal.   Pain: 5  Pain Goal: 8 [facility] wants you to be able to say your pain was always managed very well.  [name] 06/05/2017

## 2017-06-06 MED ORDER — IBUPROFEN 600 MG PO TABS: 600.0000 mg | ORAL_TABLET | Freq: Four times a day (QID) | ORAL | 0 refills | Status: DC

## 2017-06-06 NOTE — Progress Notes (Signed)
Spanish interpreter at bedside to go over 24 hour screening about baby and pt's discharge

## 2017-06-06 NOTE — Discharge Summary (Signed)
OB Discharge Summary     Patient Name: Jacqueline Walter DOB: 07-24-1981 MRN: [id number]  Date of admission: 06/05/2017 Delivering MD: Dannielle Huh   Date of discharge: 06/06/2017  Admitting diagnosis: INDUCTION Intrauterine pregnancy: [redacted]w[redacted]d     Secondary diagnosis:  Active Problems:   Post term pregnancy at [redacted] weeks gestation   NSVD (normal spontaneous vaginal delivery)  Additional problems: none     Discharge diagnosis: Term Pregnancy Delivered                                                                                                Post partum procedures: none  Augmentation: AROM, Pitocin and Foley Balloon  Complications: None  Hospital course:  Induction of Labor With Vaginal Delivery   36 y.o. yo [id number] at [redacted]w[redacted]d was admitted to the hospital 06/05/2017 for induction of labor.  Indication for induction: Postdates.  Patient had an uncomplicated labor course as follows: Membrane Rupture Time/Date: 11:40 AM ,06/05/2017   Intrapartum Procedures: Episiotomy: None [1]                                         Lacerations:  None [1]  Patient had delivery of a Viable infant.  Information for the patient's newborn:  Janace, Decker [[id number]]  Delivery Method: Vaginal, Spontaneous(Filed from Delivery Summary)   06/05/2017  Details of delivery can be found in separate delivery note.  Patient had a routine postpartum course. Patient is discharged home 06/06/17.  Physical exam  Vitals:   06/05/17 1755 06/05/17 1920 06/05/17 2320 06/06/17 0730  BP: 124/62 111/63 101/61 97/61  Pulse: 67 73 81 70  Resp: 18 18 16 18   Temp: 98.1 F (36.7 C) 98.4 F (36.9 C) 98.3 F (36.8 C) 98.1 F (36.7 C)  TempSrc: Oral Oral Oral Oral  SpO2:      Weight:      Height:       General: alert, cooperative and no distress Lochia: appropriate Uterine Fundus: firm DVT Evaluation: No evidence of DVT seen on physical exam. Negative Homan's sign. No cords or calf  tenderness. No significant calf/ankle edema. Labs: Lab Results  Component Value Date   WBC 8.4 06/05/2017   HGB 12.7 06/05/2017   HCT 37.8 06/05/2017   MCV 80.9 06/05/2017   PLT 267 06/05/2017   CMP Latest Ref Rng & Units 11/14/2016  Glucose 65 - 99 mg/dL 72  BUN 6 - 20 mg/dL 6  Creatinine 0.57 - 1.00 mg/dL 0.44(L)  Sodium 134 - 144 mmol/L 137  Potassium 3.5 - 5.2 mmol/L 3.9  Chloride 96 - 106 mmol/L 100  CO2 18 - 29 mmol/L 20  Calcium 8.7 - 10.2 mg/dL 8.7  Total Protein 6.0 - 8.5 g/dL 6.7  Total Bilirubin 0.0 - 1.2 mg/dL 0.5  Alkaline Phos 39 - 117 IU/L 58  AST 0 - 40 IU/L 32  ALT 0 - 32 IU/L 42(H)    Discharge instruction: per After Visit Summary and "Baby and Me Booklet".  After  visit meds:  Allergies as of 06/06/2017   No Known Allergies     Medication List    TAKE these medications   ibuprofen 600 MG tablet Commonly known as:  ADVIL,MOTRIN Take 1 tablet (600 mg total) every 6 (six) hours by mouth.   prenatal multivitamin Tabs tablet Take 1 tablet by mouth daily at 12 noon.       Diet: routine diet  Activity: Advance as tolerated. Pelvic rest for 6 weeks.   Outpatient follow up:4 weeks Follow up Appt: Future Appointments  Date Time Provider [place]  07/03/2017 11:00 AM Woodroe Mode, MD [place] None   Follow up Visit:No Follow-up on file.  Postpartum contraception: Nexplanon  Newborn Data: Live born female  Birth Weight: 7 lb 13.8 oz (3565 g) APGAR: 9, 9  Newborn Delivery   Birth date/time:  06/05/2017 15:37:00 Delivery type:  Vaginal, Spontaneous     Baby Feeding: Bottle and Breast Disposition:home with mother   06/06/2017 Truett Mainland, DO

## 2017-06-06 NOTE — Anesthesia Postprocedure Evaluation (Signed)
Anesthesia Post Note  Patient: Jacqueline Walter  Procedure(s) Performed: AN AD HOC LABOR EPIDURAL     Patient location during evaluation: Mother Baby Anesthesia Type: Epidural Level of consciousness: awake Pain management: pain level controlled Vital Signs Assessment: post-procedure vital signs reviewed and stable Respiratory status: spontaneous breathing Cardiovascular status: stable Postop Assessment: no headache, epidural receding and patient able to bend at knees Anesthetic complications: no    Last Vitals:  Vitals:   06/05/17 2320 06/06/17 0730  BP: 101/61 97/61  Pulse: 81 70  Resp: 16 18  Temp: 36.8 C 36.7 C  SpO2:      Last Pain:  Vitals:   06/06/17 0730  TempSrc: Oral  PainSc: 0-No pain   Pain Goal: Patients Stated Pain Goal: 7 (06/05/17 0736)               Everette Rank

## 2017-06-06 NOTE — Lactation Note (Signed)
This note was copied from a baby's chart. Lactation Consultation Note  Patient Name: Jacqueline Walter LXBWI'O Date: 06/06/2017 Reason for consult: Initial assessment Baby at 25 hr of life. Upon entry baby was cueing but was being assessed by RN. Mom reports baby is latching well. She denies breast or nipple pain. Demonstrated manual expression, colostrum noted bilaterally, spoon in room, DEBP at home. Discussed baby behavior, feeding frequency, baby belly size, voids, wt loss, breast changes, and nipple care. Given lactation handouts. Aware of OP services and support group.     Maternal Data Has patient been taught Hand Expression?: Yes  Feeding Feeding Type: Breast Fed Length of feed: (still latched at exit)  [facility] Score Latch: Repeated attempts needed to sustain latch, nipple held in mouth throughout feeding, stimulation needed to elicit sucking reflex.  Audible Swallowing: A few with stimulation  Type of Nipple: Everted at rest and after stimulation  Comfort (Breast/Nipple): Soft / non-tender  Hold (Positioning): Assistance needed to correctly position infant at breast and maintain latch.  LATCH Score: 7  Interventions    Lactation Tools Discussed/Used WIC Program: Yes   Consult Status Consult Status: Follow-up Date: 06/07/17 Follow-up type: In-patient    Denzil Hughes 06/06/2017, 4:54 PM

## 2017-06-07 MED ORDER — FENTANYL 2.5 MCG/ML BUPIVACAINE 1/10 % EPIDURAL INFUSION (WH - ANES)
INTRAMUSCULAR | Status: DC | PRN
  Administered 2017-06-05: 14 mL/h via EPIDURAL

## 2017-06-07 MED ORDER — LIDOCAINE HCL (PF) 1 % IJ SOLN
INTRAMUSCULAR | Status: DC | PRN
  Administered 2017-06-05 (×2): 4 mL via EPIDURAL

## 2017-06-07 NOTE — Anesthesia Procedure Notes (Signed)
Epidural Patient location during procedure: OB Start time: 06/05/2017 1:17 PM  Staffing Anesthesiologist: Josephine Igo, MD Performed: anesthesiologist   Preanesthetic Checklist Completed: patient identified, site marked, surgical consent, pre-op evaluation, timeout performed, IV checked, risks and benefits discussed and monitors and equipment checked  Epidural Patient position: sitting Prep: site prepped and draped and DuraPrep Patient monitoring: continuous pulse ox and blood pressure Approach: midline Location: L3-L4 Injection technique: LOR air  Needle:  Needle type: Tuohy  Needle gauge: 17 G Needle length: 9 cm and 9 Needle insertion depth: 5 cm cm Catheter type: closed end flexible Catheter size: 19 Gauge Catheter at skin depth: 10 cm Test dose: negative and Other  Assessment Events: blood not aspirated, injection not painful, no injection resistance, negative IV test and no paresthesia  Additional Notes Patient identified. Risks and benefits discussed including failed block, incomplete  Pain control, post dural puncture headache, nerve damage, paralysis, blood pressure Changes, nausea, vomiting, reactions to medications-both toxic and allergic and post Partum back pain. All questions were answered. Patient expressed understanding and wished to proceed. Sterile technique was used throughout procedure. Epidural site was Dressed with sterile barrier dressing. No paresthesias, signs of intravascular injection Or signs of intrathecal spread were encountered.  Patient was more comfortable after the epidural was dosed. Please see RN's note for documentation of vital signs and FHR which are stable.

## 2017-07-03 ENCOUNTER — Ambulatory Visit: Payer: Self-pay | Admitting: Obstetrics & Gynecology

## 2017-08-14 ENCOUNTER — Encounter: Payer: Self-pay | Admitting: *Deleted

## 2017-08-14 ENCOUNTER — Encounter: Payer: Self-pay | Admitting: Certified Nurse Midwife

## 2017-08-14 ENCOUNTER — Ambulatory Visit (INDEPENDENT_AMBULATORY_CARE_PROVIDER_SITE_OTHER): Payer: Self-pay | Admitting: Certified Nurse Midwife

## 2017-08-14 VITALS — BP 108/74 | HR 80 | Wt 137.3 lb

## 2017-08-14 DIAGNOSIS — Z1151 Encounter for screening for human papillomavirus (HPV): Secondary | ICD-10-CM

## 2017-08-14 DIAGNOSIS — Z30017 Encounter for initial prescription of implantable subdermal contraceptive: Secondary | ICD-10-CM

## 2017-08-14 DIAGNOSIS — Z3046 Encounter for surveillance of implantable subdermal contraceptive: Secondary | ICD-10-CM

## 2017-08-14 DIAGNOSIS — Z124 Encounter for screening for malignant neoplasm of cervix: Secondary | ICD-10-CM

## 2017-08-14 DIAGNOSIS — Z3202 Encounter for pregnancy test, result negative: Secondary | ICD-10-CM

## 2017-08-14 LAB — POCT URINE PREGNANCY: PREG TEST UR: NEGATIVE

## 2017-08-14 MED ORDER — ETONOGESTREL 68 MG ~~LOC~~ IMPL
68.0000 mg | DRUG_IMPLANT | Freq: Once | SUBCUTANEOUS | Status: AC
  Administered 2017-08-14: 68 mg via SUBCUTANEOUS

## 2017-08-14 NOTE — Progress Notes (Signed)
Post Partum Exam  Jacqueline Walter is a 37 y.o. [phone] female who presents for a postpartum visit. She is 10 weeks postpartum following a spontaneous vaginal delivery. I have fully reviewed the prenatal and intrapartum course. The delivery was at 83 gestational weeks.  Anesthesia: epidural. Postpartum course has been good. Baby's course has been good. Baby is feeding by breast. Bleeding no bleeding. Bowel function is normal. Bladder function is normal. Patient is not sexually active. Contraception method is Nexplanon. Postpartum depression screening:  SCORE  0 The following portions of the patient's history were reviewed and updated as appropriate: allergies, current medications, past family history, past medical history, past social history, past surgical history and problem list.  Review of Systems Pertinent items noted in HPI and remainder of comprehensive ROS otherwise negative.    Objective:  Last menstrual period 08/02/2016, unknown if currently breastfeeding.  General:  alert, cooperative and no distress   Breasts:  inspection negative, no nipple discharge or bleeding, no masses or nodularity palpable  Lungs: clear to auscultation bilaterally  Heart:  regular rate and rhythm, S1, S2 normal, no murmur, click, rub or gallop  Abdomen: soft, non-tender; bowel sounds normal; no masses,  no organomegaly   Vulva:  normal  Vagina: normal vagina, no discharge, exudate, lesion, or erythema  Cervix:  no bleeding following Pap  Corpus: normal size, contour, position, consistency, mobility, non-tender  Adnexa:  normal adnexa  Rectal Exam: Not performed.       Pap Smear: 04/28/16: [facility] at [facility]  Assessment:    Normal 6 week postpartum exam. Pap smear done at today's visit.   Plan:   1. Contraception: Nexplanon, placed today in office.  Pap Smear in office today.  2. Follow up in: 1 year for annual exam or as needed.

## 2017-08-14 NOTE — Progress Notes (Signed)
Nexplanon Procedure Note   PRE-OP DIAGNOSIS: desired long-term, reversible contraception  POST-OP DIAGNOSIS: Same  PROCEDURE: Nexplanon  placement Performing Provider: Kandis Cocking CNM   Patient education prior to procedure, explained risk, benefits of Nexplanon, reviewed alternative options. Patient reported understanding. Gave consent to continue with procedure.   PROCEDURE:  Pregnancy Text :  Negative Site (check):      left arm         Sterile Preparation:   Betadinex3 Lot # [id number]   [id number] Expiration Date 11/2017  Insertion site was selected 8 - 10 cm from medial epicondyle and marked along with guiding site using sterile marker. Procedure area was prepped and draped in a sterile fashion. 1% Lidocaine 1.5 ml given prior to procedure. Nexplanon  was inserted subcutaneously.Needle was removed from the insertion site. Nexplanon capsule was palpated by provider and patient to assure satisfactory placement. And a bandage applied and the arm was wrapped with gauze bandage.     Followup: The patient tolerated the procedure well without complications.  Instructions:  The patient was instructed to remove the dressing in 24 hours and that some bruising is to be expected.  She was advised to use over the counter analgesics as needed for any pain at the site.  She is to keep the area dry for 24 hours and to call if her hand or arm becomes cold, numb, or blue.   Kandis Cocking CNM

## 2017-08-16 LAB — CYTOLOGY - PAP
DIAGNOSIS: NEGATIVE
HPV (WINDOPATH): NOT DETECTED

## 2017-12-07 IMAGING — US US OB TRANSVAGINAL
1 series · 15 of 28 positions shown · non-contrast
Comparison: None.

CLINICAL DATA: Pregnant, lower abdominal pain/ cramping, history of
spontaneous abortions x2

EXAM:
OBSTETRIC <14 WK US AND TRANSVAGINAL OB US
TECHNIQUE: Both transabdominal and transvaginal ultrasound examinations were
performed for complete evaluation of the gestation as well as the
maternal uterus, adnexal regions, and pelvic cul-de-sac.
Transvaginal technique was performed to assess early pregnancy.

[Series 1: us ob transvaginal · 15 of 66 slices shown]
[im 1/66]
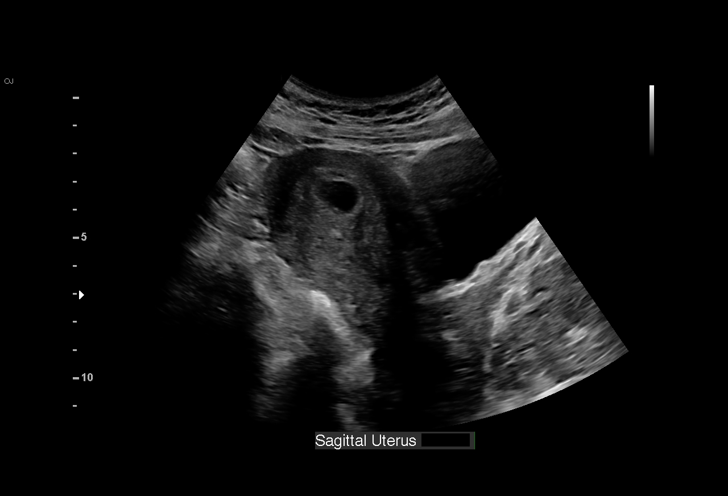
[im 5/66]
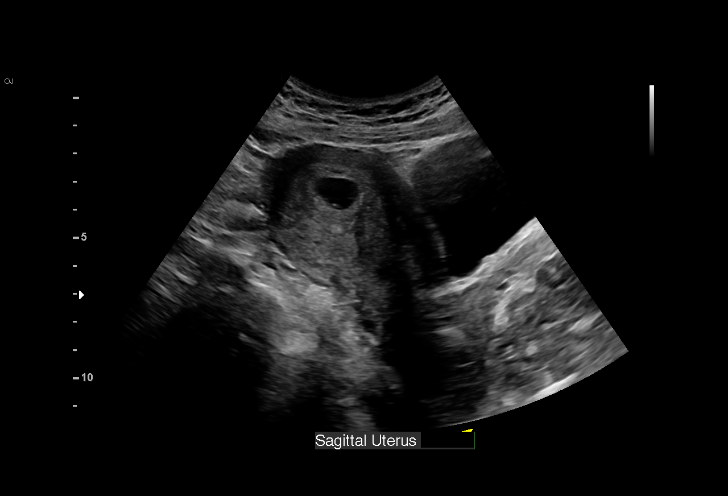
[im 10/66]
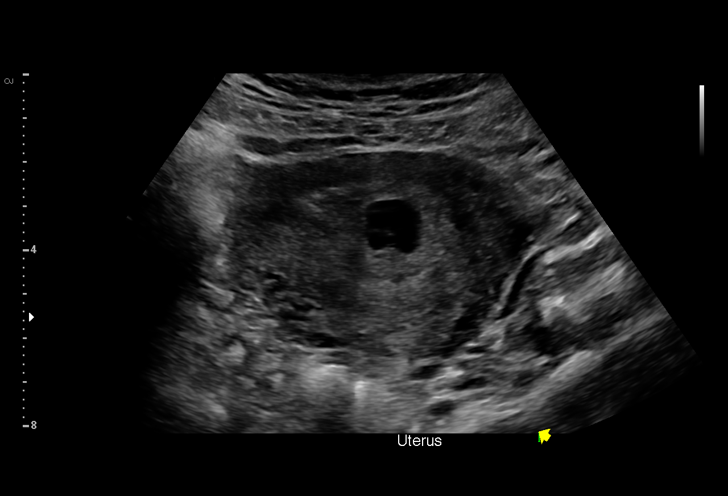
[im 15/66]
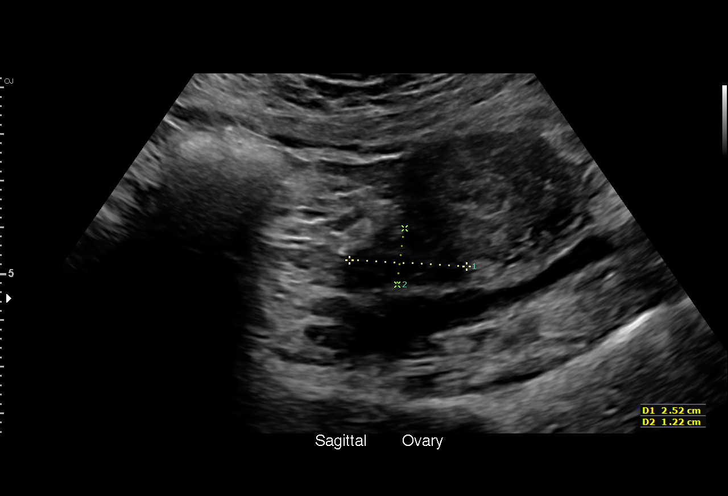
[im 20/66]
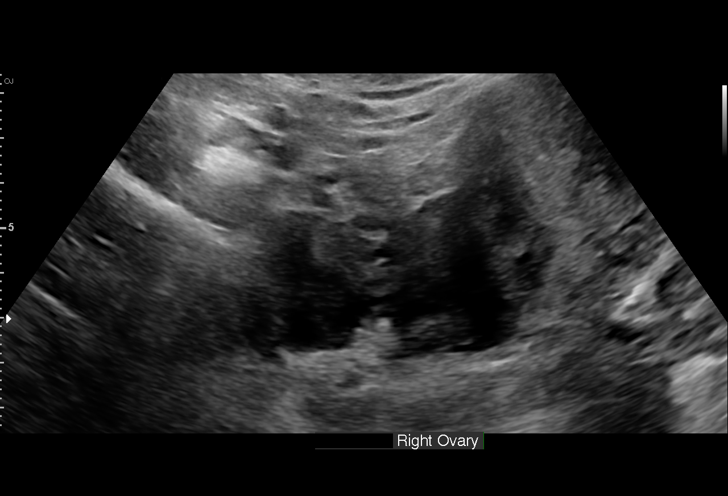
[im 25/66]
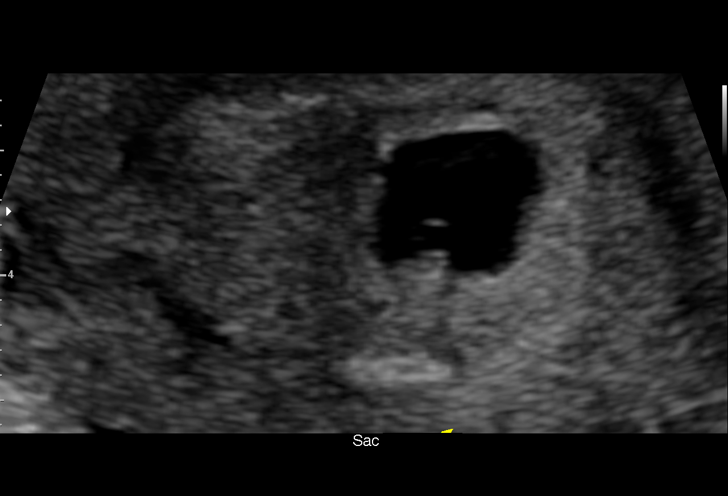
[im 29/66]
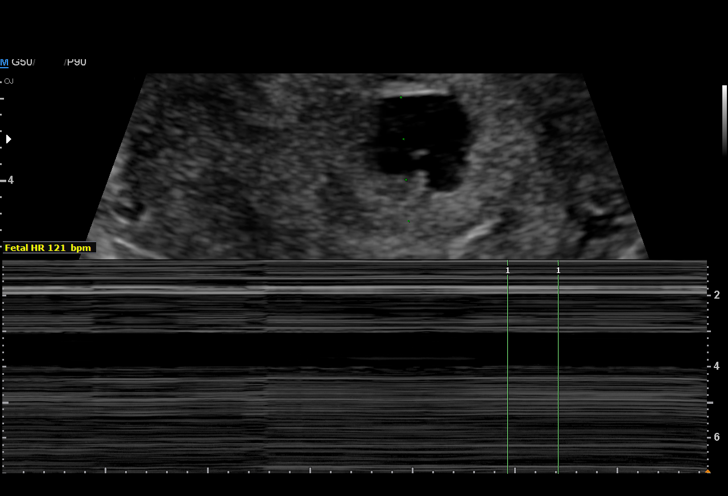
[im 34/66]
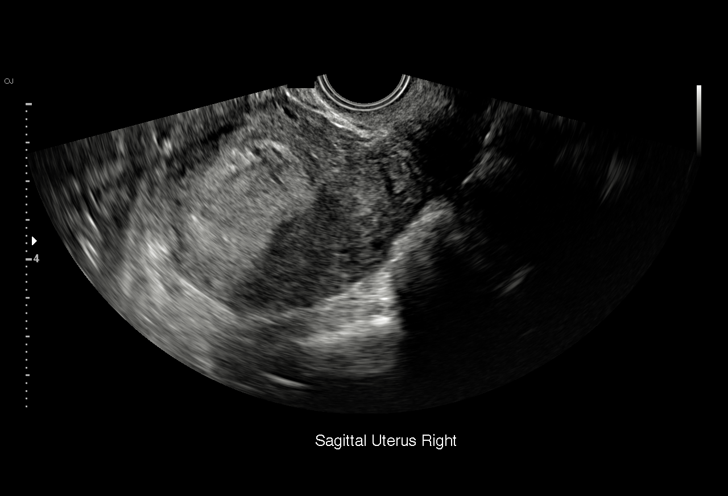
[im 37/66]
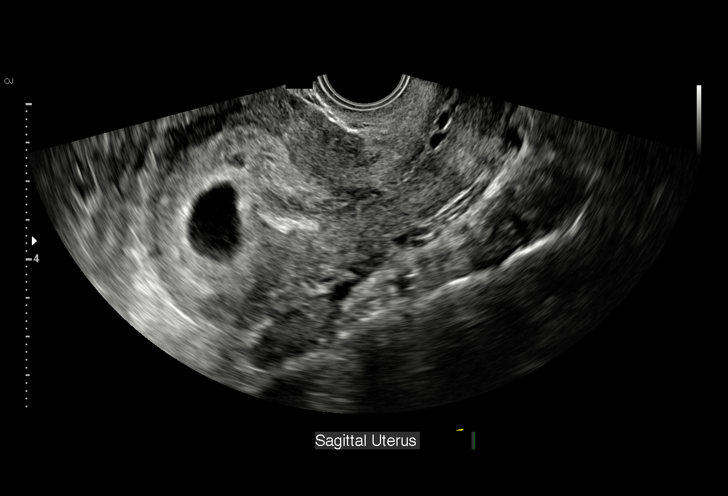
[im 41/66]
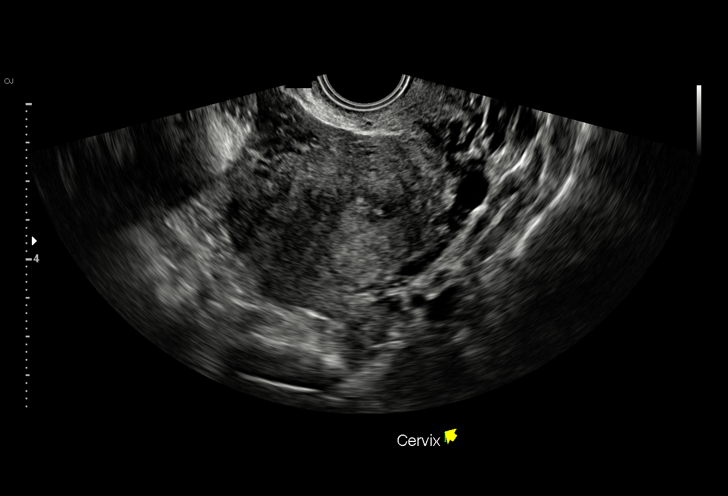
[im 46/66]
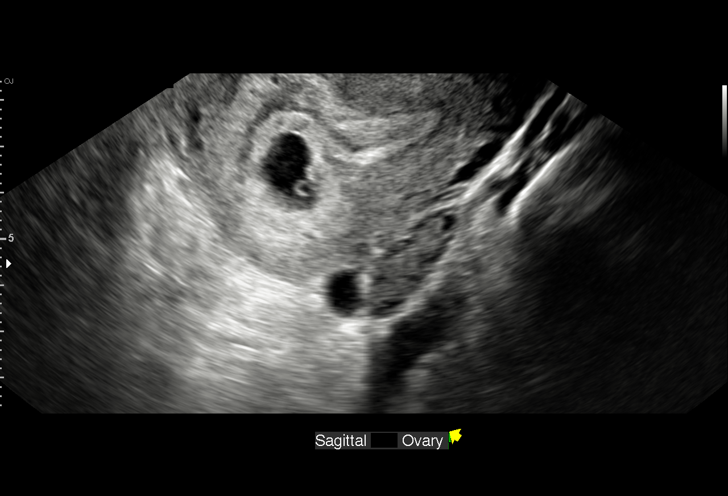
[im 51/66]
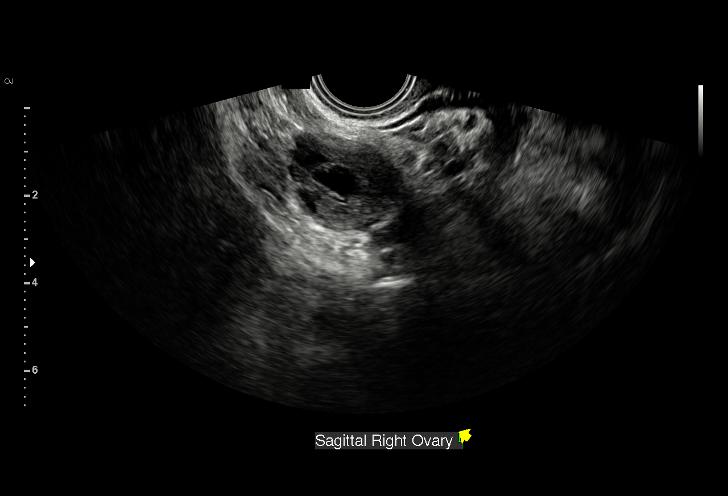
[im 56/66]
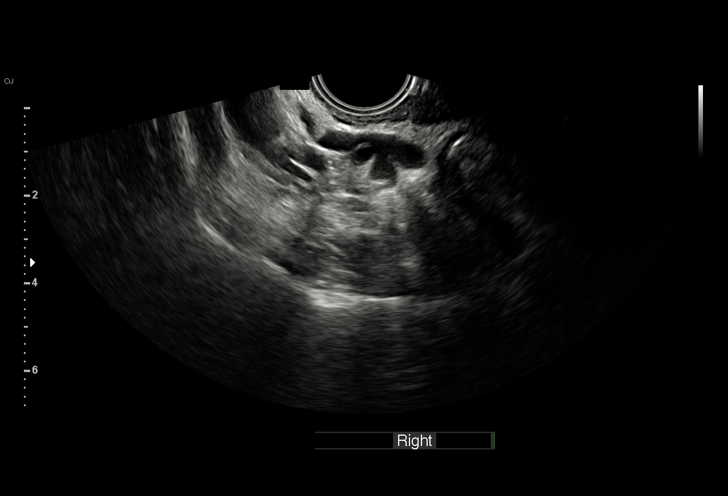
[im 61/66]
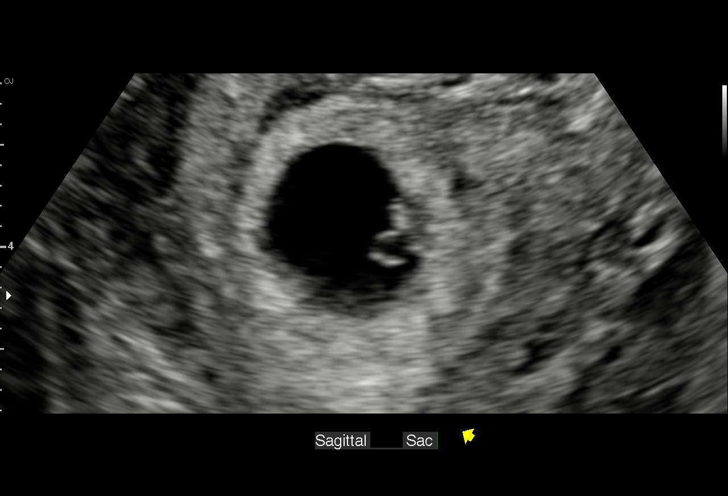
[im 66/66]
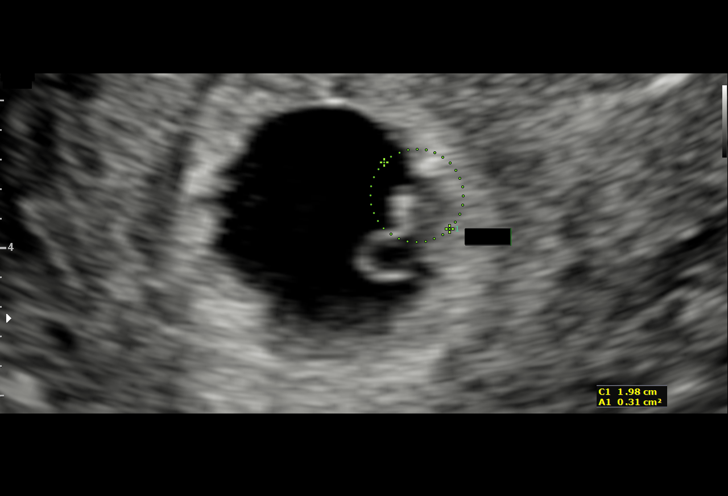

[15 of 28 positions shown; findings below may reference images not displayed]

FINDINGS: Intrauterine gestational sac: Single

Yolk sac:  Visualized.

Embryo:  Visualized.

Cardiac Activity: Visualized.

Heart Rate: 121  bpm

CRL:  3.3  mm   5 w   6 d                  US EDC: 05/29/2017

Subchorionic hemorrhage:  Small subchronic hemorrhage.

Maternal uterus/adnexae: Bilateral ovaries are within normal limits.

No free fluid.
IMPRESSION: Single live intrauterine gestation, with estimated gestational age 5
weeks 6 days by crown-rump length.

## 2019-11-10 ENCOUNTER — Ambulatory Visit: Payer: Self-pay | Attending: Internal Medicine | Admitting: Internal Medicine

## 2019-11-10 ENCOUNTER — Other Ambulatory Visit: Payer: Self-pay

## 2019-11-10 DIAGNOSIS — G4489 Other headache syndrome: Secondary | ICD-10-CM | POA: Insufficient documentation

## 2019-11-10 NOTE — Progress Notes (Signed)
Virtual Visit via Telephone Note Due to current restrictions/limitations of in-office visits due to the COVID-19 pandemic, this scheduled clinical appointment was converted to a telehealth visit  I connected with Jacqueline Walter on 11/10/19 at 2:13 p.m EDT by telephone and verified that I am speaking with the correct person using two identifiers. I am in my office.  The patient is at home.  Only the patient, myself and Abigail Butts from [employer] [phone]) participated in this encounter.  I discussed the limitations, risks, security and privacy concerns of performing an evaluation and management service by telephone and the availability of in person appointments. I also discussed with the patient that there may be a patient responsible charge related to this service. The patient expressed understanding and agreed to proceed.  History of Present Illness: Pt wanting to est care.  Last saw [place] 07/2016. Denies having any chronic medical issues.  Not on any medications.  Pt c/o intermittent headache x 3 mths.  3 episodes since they started 3 mths ago. Feels like being hit at back of head when it comes on. No blurred vision, photophobia or N/V, lacrimation or rhinorrhea.  No radiation down the neck but relief when she turns the neck. No initiating factors HA last 15 mins. Takes Ibuprofen one tab with good relief in 10 mins. No previous hx of HA.    Outpatient Encounter Medications as of 11/10/2019  Medication Sig  . etonogestrel (NEXPLANON) 68 MG IMPL implant 1 each by Subdermal route once.  Marland Kitchen ibuprofen (ADVIL,MOTRIN) 600 MG tablet Take 1 tablet (600 mg total) every 6 (six) hours by mouth.  . Prenatal Vit-Fe Fumarate-FA (PRENATAL MULTIVITAMIN) TABS tablet Take 1 tablet by mouth daily at 12 noon.   No facility-administered encounter medications on file as of 11/10/2019.    Observations/Objective: No direct observation done  Assessment and Plan: 1. Headache syndrome Not consistent  with migraine, tension or cluster headaches.  She reports good and timely relief with ibuprofen so have informed her to continue taking it as needed.  Advised that she follow-up in person should the headaches become more frequent or lasts longer.  She expressed understanding.   Follow Up Instructions: PRN   I discussed the assessment and treatment plan with the patient. The patient was provided an opportunity to ask questions and all were answered. The patient agreed with the plan and demonstrated an understanding of the instructions.   The patient was advised to call back or seek an in-person evaluation if the symptoms worsen or if the condition fails to improve as anticipated.  I provided 17 minutes of non-face-to-face time during this encounter.   Karle Plumber, MD

## 2020-04-06 ENCOUNTER — Other Ambulatory Visit: Payer: Self-pay | Admitting: Radiology

## 2020-04-06 ENCOUNTER — Other Ambulatory Visit: Payer: Self-pay

## 2020-04-06 DIAGNOSIS — Z20822 Contact with and (suspected) exposure to covid-19: Secondary | ICD-10-CM

## 2020-04-08 LAB — SARS-COV-2, NAA 2 DAY TAT

## 2020-04-08 LAB — NOVEL CORONAVIRUS, NAA: SARS-CoV-2, NAA: NOT DETECTED

## 2020-04-13 ENCOUNTER — Other Ambulatory Visit: Payer: Self-pay

## 2020-05-21 ENCOUNTER — Other Ambulatory Visit: Payer: Self-pay

## 2020-05-21 ENCOUNTER — Ambulatory Visit (HOSPITAL_COMMUNITY)
Admission: EM | Admit: 2020-05-21 | Discharge: 2020-05-21 | Disposition: A | Discharge status: Home / Self Care | Payer: Self-pay

## 2020-05-21 ENCOUNTER — Encounter (HOSPITAL_COMMUNITY): Payer: Self-pay | Admitting: *Deleted

## 2020-05-21 DIAGNOSIS — M542 Cervicalgia: Secondary | ICD-10-CM

## 2020-05-21 DIAGNOSIS — M62838 Other muscle spasm: Secondary | ICD-10-CM

## 2020-05-21 MED ORDER — TIZANIDINE HCL 4 MG PO TABS: 4.0000 mg | ORAL_TABLET | Freq: Three times a day (TID) | ORAL | 0 refills | Status: DC | PRN

## 2020-05-21 MED ORDER — NAPROXEN 500 MG PO TABS: 500.0000 mg | ORAL_TABLET | Freq: Two times a day (BID) | ORAL | 0 refills | Status: DC

## 2020-05-21 NOTE — ED Triage Notes (Addendum)
Patient in with complaints to bump to the the right side of neck that causes pain on the right side of head since January off and on. Area noted to be painful to touch.Patient states that since Saturday pain has increased and is constant. Patient has not taken any medication at home for the pain. Patient denies any vision changes, dizziness or feeling lightheaded. Patient states that she notices with using 2lb weights while running in the morning her right hand becomes numb. Patient denies numbness to the right hand at this time and states that it only occurs with holding the weight. Patient states that this has occurred since January.

## 2020-05-21 NOTE — ED Provider Notes (Signed)
[place]   MRN: [id number] DOB: 1981/04/14  Subjective:   Jacqueline Walter is a 39 y.o. female presenting for 72-month history of persistent intermittent moderate sharp pains and spasms along her neck and trapezius muscles.  Patient states that up until the last 2 to 4 weeks she was working out very strenuously, doing a lot of cardio but also video workouts.  She tries to stay hydrated with at least 2 L of water.  Denies falls, trauma.  Denies fever, runny or stuffy nose, sore throat, cough, chest pain, shortness of breath, rashes.  She has not taken any medications.  No current facility-administered medications for this encounter.  Current Outpatient Medications:    etonogestrel (NEXPLANON) 68 MG IMPL implant, 1 each by Subdermal route once., Disp: , Rfl:    ibuprofen (ADVIL,MOTRIN) 600 MG tablet, Take 1 tablet (600 mg total) every 6 (six) hours by mouth., Disp: 30 tablet, Rfl: 0   Prenatal Vit-Fe Fumarate-FA (PRENATAL MULTIVITAMIN) TABS tablet, Take 1 tablet by mouth daily at 12 noon., Disp: , Rfl:    No Known Allergies  Past Medical History:  Diagnosis Date   Breast mass, left    removed 2014; benign   Medical history non-contributory      Past Surgical History:  Procedure Laterality Date   MASS EXCISION Left 03/19/2013   Procedure: excision of extraneous breast/fat tissue left axilla;  Surgeon: Haywood Lasso, MD;  Location: [facility];  Service: General;  Laterality: Left;    Family History  Problem Relation Age of Onset   Colon cancer Maternal Grandmother    Cancer Maternal Grandmother        unknown   Heart disease Neg Hx    Diabetes Neg Hx     Social History   Tobacco Use   Smoking status: Never Smoker   Smokeless tobacco: Never Used  Substance Use Topics   Alcohol use: No   Drug use: No    ROS   Objective:   Vitals: BP 126/76 (BP Location: Right Arm)    Pulse 77    Temp 99.1 F (37.3 C)  (Oral)    SpO2 100%   Physical Exam Constitutional:      General: She is not in acute distress.    Appearance: Normal appearance. She is well-developed. She is not ill-appearing, toxic-appearing or diaphoretic.  HENT:     Head: Normocephalic and atraumatic.     Nose: Nose normal.     Mouth/Throat:     Mouth: Mucous membranes are moist.     Pharynx: Oropharynx is clear.  Eyes:     General: No scleral icterus.       Right eye: No discharge.        Left eye: No discharge.     Extraocular Movements: Extraocular movements intact.     Conjunctiva/sclera: Conjunctivae normal.     Pupils: Pupils are equal, round, and reactive to light.  Cardiovascular:     Rate and Rhythm: Normal rate.  Pulmonary:     Effort: Pulmonary effort is normal.  Musculoskeletal:     Cervical back: Normal range of motion and neck supple. Spasms (Significant over areas outlined) and tenderness (focal tenderness over areas outlined) present. No swelling, edema, deformity, erythema, signs of trauma, lacerations, rigidity, torticollis, bony tenderness or crepitus. Pain with movement present. Normal range of motion.       Back:     Comments: Negative Lhermitte and Spurling maneuvers.  Full range of  motion for her neck, full range of motion for upper and lower extremities.  Strength 5/5 throughout.  Skin:    General: Skin is warm and dry.  Neurological:     General: No focal deficit present.     Mental Status: She is alert and oriented to person, place, and time.     Deep Tendon Reflexes: Reflexes normal.  Psychiatric:        Mood and Affect: Mood normal. Mood is not anxious or depressed.        Speech: Speech normal.        Behavior: Behavior normal. Behavior is not agitated.        Thought Content: Thought content normal.        Cognition and Memory: Cognition is not impaired. Memory is not impaired.        Judgment: Judgment normal.      Assessment and Plan :   PDMP not reviewed this encounter.  1. Neck  pain   2. Trapezius muscle spasm   3. Muscle spasm     Suspect significant muscle spasms as a source of pain.  Recommended conservative management to start with with naproxen, tizanidine.  Continue daily hydration.  Recommended patient seek out physical therapy or massage therapy. Counseled patient on potential for adverse effects with medications prescribed/recommended today, ER and return-to-clinic precautions discussed, patient verbalized understanding.    Jaynee Eagles, PA-C 05/21/20 1356

## 2020-05-21 NOTE — Discharge Instructions (Signed)
Puedes tomar naproxen dos veces al dia con comida. Este medicamento es para Social research officer, government. Tizanidine es un relajante de musculo. Si le da sueno tome el medicamento por la tarde.

## 2020-08-05 ENCOUNTER — Encounter: Payer: Self-pay | Admitting: Internal Medicine

## 2020-08-05 ENCOUNTER — Other Ambulatory Visit: Payer: Self-pay

## 2020-08-05 ENCOUNTER — Ambulatory Visit: Payer: Self-pay | Admitting: Internal Medicine

## 2020-08-05 VITALS — BP 104/58 | HR 72 | Resp 14 | Ht 60.5 in | Wt 131.0 lb

## 2020-08-05 DIAGNOSIS — N6320 Unspecified lump in the left breast, unspecified quadrant: Secondary | ICD-10-CM | POA: Insufficient documentation

## 2020-08-05 DIAGNOSIS — L235 Allergic contact dermatitis due to other chemical products: Secondary | ICD-10-CM

## 2020-08-05 DIAGNOSIS — R6882 Decreased libido: Secondary | ICD-10-CM

## 2020-08-05 NOTE — Progress Notes (Signed)
Subjective:    Patient ID: Jacqueline Walter, female   DOB: 1981/02/16, 40 y.o.   MRN: [id number]   HPI   Here to establish Jacqueline Walter interprets  1.  Needs Nexplanon removed.  Had placed at [facility].  2.  Decreased Libido:  Lives with her long term boyfriend.  She is not certain that her lack of interest bothers him much, though he has made complaints in past.   Perhaps has never had much of a libido.   Has not noted much of a change with libido since Nexplanon. Before having children, she would say she was more interested in sex.  Again, not clear a huge difference, however.   G4P2SAB2, both SABs in first trimester. She has a 36 and 39 year old at home.  She is a Futures trader.   Menarche at age 42 yo. Has never had a regular period before placement of Nexplanon in 2019.  Can skip 3- 4 months even. No history of mental, physical or sexual abuse. Bedtime at 9 to 9:30 p.m.  Up at 6 a.m. She could use some help with household chores. He is good at focusing on her with sexual encounters.  She feels attractive and wanted by him even when intimacy not moving toward intercourse.   She does enjoy sex when they have intercourse. She is not interested in counseling regarding this.  Itchy flaky inner upper eyelids bilaterally.  Uses OTC hydrocortisone with some relief  Current Meds  Medication Sig   etonogestrel (NEXPLANON) 68 MG IMPL implant 1 each by Subdermal route once.   No Known Allergies   Past Medical History:  Diagnosis Date   Breast mass, left    removed 2014; benign    Past Surgical History:  Procedure Laterality Date   MASS EXCISION Left 03/19/2013   Procedure: excision of extraneous breast/fat tissue left axilla;  Surgeon: Currie Paris, MD;  Location: [facility];  Service: General;  Laterality: Left;    Family History  Problem Relation Age of Onset   Other Father        COVID   Cancer Maternal Grandmother         unknown   Colon cancer Maternal Grandmother 40    Social History   Socioeconomic History   Marital status: Domestic Partner    Spouse name: Jacqueline Walter   Number of children: 2   Years of education: 8   Highest education level: Not on file  Occupational History   Occupation: Housewife  Tobacco Use   Smoking status: Never Smoker   Smokeless tobacco: Never Used  Building services engineer Use: Never used  Substance and Sexual Activity   Alcohol use: No   Drug use: No   Sexual activity: Yes    Birth control/protection: Implant  Other Topics Concern   Not on file  Social History Narrative   Lives at home with long term boyfriend and their 2 children   Social Determinants of Health   Financial Resource Strain: Low Risk    Difficulty of Paying Living Expenses: Not hard at all  Food Insecurity: No Food Insecurity   Worried About Programme researcher, broadcasting/film/video in the Last Year: Never true   Ran Out of Food in the Last Year: Never true  Transportation Needs: No Transportation Needs   Lack of Transportation (Medical): No   Lack of Transportation (Non-Medical): No  Physical Activity: Not on file  Stress: No Stress Concern Present  Feeling of Stress : Not at all  Social Connections: Moderately Integrated   Frequency of Communication with Friends and Family: More than three times a week   Frequency of Social Gatherings with Friends and Family: More than three times a week   Attends Religious Services: 1 to 4 times per year   Active Member of [employer] or Organizations: No   Attends Banker Meetings: Never   Marital Status: Living with partner  Intimate Partner Violence: Not At Risk   Fear of Current or Ex-Partner: No   Emotionally Abused: No   Physically Abused: No   Sexually Abused: No      Review of Systems    Objective:   BP (!) 104/58 (BP Location: Left Arm, Patient Position: Sitting, Cuff Size: Normal)   Pulse 72   Resp 14   Ht 5' 0.5" (1.537 m)   Wt 131 lb (59.4  kg)   BMI 25.16 kg/m   Physical Exam  NAD HEENT:  PERRL, EOMI, TMs pearly gray.   Dry skin with increased skin folds at nasal upper eyelids bilaterally--rubs with dorsal painted nail of index fingers when discussing her eyelids. Neck:  Supple, No adenopathy, no thyromegaly Chest:  CTA CV:  RRR with normal S1 and S2, No S3, S4 or murmur.  Carotid, radial and DP/PT pulses normal and equal Abd:  S, NT, No HSM or mass, + BS LE:  No edema.    Assessment & Plan    Possible lack of Libido:  Will schedule for CPE with pap/pelvic.  CBC, CMP, TSH, lipid panel.  While this is her main complaint, not clear for whom this is a problem.  Does not sound like her interest in sex has changed over time.  Encouraged counseling to see if anything underlying what she feels is decreased libido, though she is not interested today.  2.  Oligomenorrhea, chronic:  consider PCOS with her history since menarche.  History of Nexplanon almost 3 years ago, which has affected recent menstrual history.    3.  Request to remove Nexplanon:  discussed would need to have done at [facility] clinic.  4.  Contact dermatitis to eyelids vs. Atopic dermatitis:  asked her to stop using nail polish for 2-3 months and see if resolves.  May apply OTC Hydrocortisone cream to affected areas twice daily.  Dove Soap for washing face.  Eucerin Cream for Eczema relief after washing face/bathing.
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
## 2020-08-06 LAB — CBC WITH DIFFERENTIAL/PLATELET
Basophils Absolute: 0 10*3/uL (ref 0.0–0.2)
Basos: 1 %
EOS (ABSOLUTE): 0.1 10*3/uL (ref 0.0–0.4)
Eos: 2 %
Hematocrit: 43.7 % (ref 34.0–46.6)
Hemoglobin: 14.5 g/dL (ref 11.1–15.9)
Immature Grans (Abs): 0 10*3/uL (ref 0.0–0.1)
Immature Granulocytes: 0 %
Lymphocytes Absolute: 2.3 10*3/uL (ref 0.7–3.1)
Lymphs: 37 %
MCH: 27.6 pg (ref 26.6–33.0)
MCHC: 33.2 g/dL (ref 31.5–35.7)
MCV: 83 fL (ref 79–97)
Monocytes Absolute: 0.5 10*3/uL (ref 0.1–0.9)
Monocytes: 8 %
Neutrophils Absolute: 3.3 10*3/uL (ref 1.4–7.0)
Neutrophils: 52 %
Platelets: 285 10*3/uL (ref 150–450)
RBC: 5.26 x10E6/uL (ref 3.77–5.28)
RDW: 13.4 % (ref 11.7–15.4)
WBC: 6.3 10*3/uL (ref 3.4–10.8)

## 2020-08-06 LAB — COMPREHENSIVE METABOLIC PANEL
ALT: 10 IU/L (ref 0–32)
AST: 19 IU/L (ref 0–40)
Albumin/Globulin Ratio: 1.5 (ref 1.2–2.2)
Albumin: 4.7 g/dL (ref 3.8–4.8)
Alkaline Phosphatase: 82 IU/L (ref 44–121)
BUN/Creatinine Ratio: 21 (ref 9–23)
BUN: 12 mg/dL (ref 6–20)
Bilirubin Total: 0.9 mg/dL (ref 0.0–1.2)
CO2: 23 mmol/L (ref 20–29)
Calcium: 9.4 mg/dL (ref 8.7–10.2)
Chloride: 101 mmol/L (ref 96–106)
Creatinine, Ser: 0.56 mg/dL — ABNORMAL LOW (ref 0.57–1.00)
GFR calc Af Amer: 137 mL/min/{1.73_m2} (ref >59)
GFR calc non Af Amer: 119 mL/min/{1.73_m2} (ref >59)
Globulin, Total: 3.2 g/dL (ref 1.5–4.5)
Glucose: 85 mg/dL (ref 65–99)
Potassium: 5 mmol/L (ref 3.5–5.2)
Sodium: 139 mmol/L (ref 134–144)
Total Protein: 7.9 g/dL (ref 6.0–8.5)

## 2020-08-06 LAB — LIPID PANEL W/O CHOL/HDL RATIO
Cholesterol, Total: 228 mg/dL — ABNORMAL HIGH (ref 100–199)
HDL: 62 mg/dL (ref >39)
LDL Chol Calc (NIH): 140 mg/dL — ABNORMAL HIGH (ref 0–99)
Triglycerides: 147 mg/dL (ref 0–149)
VLDL Cholesterol Cal: 26 mg/dL (ref 5–40)

## 2020-08-06 LAB — TSH: TSH: 2.3 u[IU]/mL (ref 0.450–4.500)

## 2020-08-18 ENCOUNTER — Encounter: Payer: Self-pay | Admitting: Internal Medicine

## 2020-12-10 ENCOUNTER — Encounter: Payer: Self-pay | Admitting: Internal Medicine

## 2021-02-11 ENCOUNTER — Encounter (HOSPITAL_COMMUNITY): Payer: Self-pay | Admitting: Emergency Medicine

## 2021-02-11 ENCOUNTER — Ambulatory Visit (HOSPITAL_COMMUNITY)
Admission: EM | Admit: 2021-02-11 | Discharge: 2021-02-11 | Disposition: A | Discharge status: Home / Self Care | Payer: Self-pay | Attending: Student | Admitting: Student

## 2021-02-11 ENCOUNTER — Other Ambulatory Visit: Payer: Self-pay

## 2021-02-11 DIAGNOSIS — H01131 Eczematous dermatitis of right upper eyelid: Secondary | ICD-10-CM

## 2021-02-11 DIAGNOSIS — Z789 Other specified health status: Secondary | ICD-10-CM

## 2021-02-11 NOTE — ED Triage Notes (Addendum)
Right eyelid/skin near eye problem x 8 months, itching, uses cortizone ointment, with dry very itchy skin. No visible irritation on assessment. Denies any changes to vision, new eye irritation or discharge. Denies eye/visual problems

## 2021-02-11 NOTE — ED Provider Notes (Signed)
[place]    CSN: [id number] Arrival date & time: 02/11/21  1201      History   Chief Complaint Chief Complaint  Patient presents with   Pruritis   Skin Problem    HPI Jacqueline Walter is a 40 y.o. female presenting with right upper eyelid irritation for 8 months.  Medical history noncontributory.  Endorses symptoms for about 8 months, itching and burning.  Hydrocortisone ointment improves his symptoms, but they come back when she stopped using this.  Denies any ocular involvement.  Denies photophobia, foreign body sensation, eye redness, eye crusting in the morning, eye pain, eye pain with movement, injury to eye, vision changes, double vision, excessive tearing, burning eyes. Wears glasses, not contacts.    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 Authorizing Provider  etonogestrel (NEXPLANON) 68 MG IMPL implant 1 each by Subdermal route once.    [provider]    Family History Family History  Problem Relation Age of Onset   Other Father        COVID   Cancer Maternal Grandmother        unknown   Colon cancer Maternal Grandmother 70    Social History Social  History   Tobacco Use   Smoking status: Never   Smokeless tobacco: Never  Vaping Use   Vaping Use: Never used  Substance Use Topics   Alcohol use: No   Drug use: No     Allergies   Patient has no known allergies.   Review of Systems Review of Systems  Skin:  Positive for rash.  All other systems reviewed and are negative.   Physical Exam Triage Vital Signs ED Triage Vitals [02/11/21 1411]  Enc Vitals Group     BP 106/76     Pulse Rate 69     Resp 14     Temp 98.2 F (36.8 C)     Temp Source Oral     SpO2 99 %     Weight      Height      Head Circumference      Peak Flow      Pain Score      Pain Loc      Pain Edu?      Excl. in [place]?    No data found.  Updated Vital Signs BP 106/76 (BP Location: Right Arm)   Pulse  69   Temp 98.2 F (36.8 C) (Oral)   Resp 14   SpO2 99%   Visual Acuity Right Eye Distance: 20/25 Left Eye Distance: 20/30 Bilateral Distance: 20/25  Right Eye Near:   Left Eye Near:    Bilateral Near:     Physical Exam Vitals reviewed.  Constitutional:      General: She is not in acute distress.    Appearance: Normal appearance. She is not ill-appearing or diaphoretic.  HENT:     Head: Normocephalic and atraumatic.  Eyes:     Comments: R upper eyelid with minimal scaling. No erythema, warmth, swelling. PERRLA, EOMI, no conjunctival injection. Visual acuity intact.  Cardiovascular:     Rate and Rhythm: Normal rate and regular rhythm.     Heart sounds: Normal heart sounds.  Pulmonary:     Effort: Pulmonary effort is normal.     Breath sounds: Normal breath sounds.  Skin:    General: Skin is warm.  Neurological:     General: No focal deficit present.     Mental Status: She is alert and oriented to person, place, and time.  Psychiatric:        Mood and Affect: Mood normal.        Behavior: Behavior normal.        Thought Content: Thought content normal.        Judgment: Judgment normal.     UC Treatments / Results   Labs (all labs ordered are listed, but only abnormal results are displayed) Labs Reviewed - No data to display  EKG   Radiology No results found.  Procedures Procedures (including critical care time)  Medications Ordered in UC Medications - No data to display  Initial Impression / Assessment and Plan / UC Course  I have reviewed the triage vital signs and the nursing notes.  Pertinent labs & imaging results that were available during my care of the patient were reviewed by me and considered in my medical decision making (see chart for details).     This patient is a very pleasant 40 y.o. year old female presenting with eczema vs seborrheic dermatitis. Visual acuity intact.   Aquaphor, hydrocortisone, f/u with derm .  ED return precautions discussed. Patient verbalizes understanding and agreement.   Spoke with this patient using langauge line.   Final Clinical Impressions(s) / UC Diagnoses   Final diagnoses:  Eczematous dermatitis of right upper eyelid  Language barrier     Discharge Instructions      Vaseline or Aquaphor 1-2x daily as needed Continue hydrocortisone as needed Follow-up with derm      ED Prescriptions   None    PDMP not reviewed this encounter.   Hazel Sams, PA-C 02/11/21 1447

## 2021-02-11 NOTE — Discharge Instructions (Addendum)
Vaseline or Aquaphor 1-2x daily as needed Continue hydrocortisone as needed Follow-up with derm

## 2021-08-01 DIAGNOSIS — R6882 Decreased libido: Secondary | ICD-10-CM | POA: Insufficient documentation

## 2021-08-01 DIAGNOSIS — L259 Unspecified contact dermatitis, unspecified cause: Secondary | ICD-10-CM | POA: Insufficient documentation

## 2022-08-22 ENCOUNTER — Ambulatory Visit
Admission: EM | Admit: 2022-08-22 | Discharge: 2022-08-22 | Disposition: A | Discharge status: Home / Self Care | Payer: Self-pay | Attending: Internal Medicine | Admitting: Internal Medicine

## 2022-08-22 ENCOUNTER — Ambulatory Visit (INDEPENDENT_AMBULATORY_CARE_PROVIDER_SITE_OTHER): Payer: Self-pay

## 2022-08-22 DIAGNOSIS — M79672 Pain in left foot: Secondary | ICD-10-CM

## 2022-08-22 NOTE — ED Provider Notes (Signed)
[facility]    CSN: [id number] Arrival date & time: 08/22/22  1807      History   Chief Complaint Chief Complaint  Patient presents with   Foot Injury    HPI Jacqueline Walter is a 42 y.o. female.   Patient presents with left foot pain after injury that occurred approximately 1 to 2 hours prior to arrival.  Patient reports she dropped a piece of wood on her foot.  Denies any ankle pain.  Denies numbness or tingling.  Patient has taken Motrin with minimal improvement.   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/01/2021   Decreased libido 08/01/2021   Breast mass, left    Headache syndrome 11/10/2019   Maternal varicella, non-immune 11/15/2016   Benign neoplasm of breast 03/31/2010    Past Surgical History:  Procedure Laterality Date   MASS EXCISION Left 03/19/2013   Procedure: excision of extraneous breast/fat tissue left axilla;  Surgeon: Haywood Lasso, MD;  Location: [facility];  Service: General;  Laterality: Left;    OB History     Gravida  4   Para  2   Term  2   Preterm  0   AB  2   Living  2      SAB  2   IAB  0   Ectopic  0   Multiple      Live Births  2            Home Medications    Prior to Admission medications   Medication Sig Start Date End Date Taking? Authorizing Provider  etonogestrel (NEXPLANON) 68 MG IMPL implant 1 each by Subdermal route once.    [provider]    Family History Family History  Problem Relation Age of Onset   Other Father        COVID   Cancer Maternal Grandmother        unknown   Colon cancer Maternal Grandmother 70    Social History Social History   Tobacco Use   Smoking status: Never   Smokeless tobacco: Never  Vaping Use   Vaping Use: Never used  Substance Use Topics   Alcohol use: No   Drug  use: No     Allergies   Patient has no known allergies.   Review of Systems Review of Systems Per HPI  Physical Exam Triage Vital Signs ED Triage Vitals [08/22/22 1926]  Enc Vitals Group     BP 115/70     Pulse Rate 71     Resp 19     Temp 97.7 F (36.5 C)     Temp src      SpO2 98 %     Weight      Height      Head Circumference      Peak Flow      Pain Score 8     Pain Loc      Pain Edu?      Excl. in [place]?    No data found.  Updated Vital Signs BP 115/70   Pulse 71   Temp 97.7 F (36.5 C)   Resp 19   LMP 08/22/2022   SpO2 98%   Breastfeeding No   Visual Acuity Right Eye Distance:   Left Eye Distance:  Bilateral Distance:    Right Eye Near:   Left Eye Near:    Bilateral Near:     Physical Exam Constitutional:      General: She is not in acute distress.    Appearance: Normal appearance. She is not toxic-appearing or diaphoretic.  HENT:     Head: Normocephalic and atraumatic.  Eyes:     Extraocular Movements: Extraocular movements intact.     Conjunctiva/sclera: Conjunctivae normal.  Pulmonary:     Effort: Pulmonary effort is normal.  Feet:     Comments: Tenderness to palpation with a mild swelling and associated mild erythema present to dorsal surface of foot overlying first and second metatarsal.  Patient also also has tenderness throughout all of toes.  Patient can wiggle toes.  No tenderness to ankle.  Capillary refill and pulses intact.  No lacerations or abrasions noted. Neurological:     General: No focal deficit present.     Mental Status: She is alert and oriented to person, place, and time. Mental status is at baseline.  Psychiatric:        Mood and Affect: Mood normal.        Behavior: Behavior normal.        Thought Content: Thought content normal.        Judgment: Judgment normal.      UC Treatments / Results  Labs (all labs ordered are listed, but only abnormal results are displayed) Labs Reviewed - No data to  display  EKG   Radiology DG Foot Complete Left  Result Date: 08/22/2022 CLINICAL DATA:  injury EXAM: LEFT FOOT - COMPLETE 3+ VIEW COMPARISON:  None Available. FINDINGS: There is no evidence of fracture or dislocation. Early hallux valgus deformity. There is no evidence of arthropathy or other focal bone abnormality. Soft tissues are unremarkable. IMPRESSION: No acute findings Electronically Signed   By: Lucrezia Europe M.D.   On: 08/22/2022 20:01    Procedures Procedures (including critical care time)  Medications Ordered in UC Medications - No data to display  Initial Impression / Assessment and Plan / UC Course  I have reviewed the triage vital signs and the nursing notes.  Pertinent labs & imaging results that were available during my care of the patient were reviewed by me and considered in my medical decision making (see chart for details).     Left foot x-ray was negative for any acute bony abnormality.  Suspect contusion related to mechanism of injury.  Advised ice application and elevation of extremity.  Advised safe over-the-counter pain relievers. Patient advised to follow-up with orthopedist if symptoms persist or worsen.  Patient verbalized understanding and was agreeable with plan. Final Clinical Impressions(s) / UC Diagnoses   Final diagnoses:  Left foot pain     Discharge Instructions      X-ray was normal.  Suspect bruising to the foot.  Apply ice and elevate extremity.  Follow-up with orthopedist if symptoms persist or worsen.     ED Prescriptions   None    PDMP not reviewed this encounter.   Teodora Medici, [place] 08/22/22 2020

## 2022-08-22 NOTE — Discharge Instructions (Signed)
X-ray was normal.  Suspect bruising to the foot.  Apply ice and elevate extremity.  Follow-up with orthopedist if symptoms persist or worsen.

## 2022-08-22 NOTE — ED Triage Notes (Signed)
Pt presents to [facility] with co of L foot pain after left foot injury today when she was cleaning her house and a piece of wood came down and hit her in the foot. Pt reports pain is 8/10 did take 800 mg motrin

## 2023-06-20 ENCOUNTER — Telehealth: Payer: Self-pay

## 2023-06-20 NOTE — Telephone Encounter (Signed)
Telephoned patient using language line interpreter#[phone]. Left a voice message with [employer] (scholarship) contact information.

## 2023-07-23 NOTE — Telephone Encounter (Signed)
Telephoned patient at mobile number using interpreter#[id number]. Left a voice message with [employer] (scholarship) information.

## 2023-11-22 ENCOUNTER — Other Ambulatory Visit: Payer: Self-pay | Admitting: Obstetrics and Gynecology

## 2023-11-22 DIAGNOSIS — Z1231 Encounter for screening mammogram for malignant neoplasm of breast: Secondary | ICD-10-CM
